# Patient Record
Sex: Male | Born: 1990 | Race: Black or African American | Hispanic: No | Marital: Single | Smoking: Former smoker
Health system: Southern US, Community
[De-identification: ages and names within clinical notes are randomized; demographics above are authoritative.]

---

## 2011-01-02 ENCOUNTER — Emergency Department (HOSPITAL_COMMUNITY)
Admission: EM | Admit: 2011-01-02 | Discharge: 2011-01-02 | Disposition: A | Discharge status: Home / Self Care | Payer: Self-pay | Attending: Emergency Medicine | Admitting: Emergency Medicine

## 2011-01-02 DIAGNOSIS — F410 Panic disorder [episodic paroxysmal anxiety] without agoraphobia: Secondary | ICD-10-CM | POA: Insufficient documentation

## 2011-01-02 DIAGNOSIS — E119 Type 2 diabetes mellitus without complications: Secondary | ICD-10-CM | POA: Insufficient documentation

## 2011-01-02 DIAGNOSIS — J45909 Unspecified asthma, uncomplicated: Secondary | ICD-10-CM | POA: Insufficient documentation

## 2011-01-02 DIAGNOSIS — Z79899 Other long term (current) drug therapy: Secondary | ICD-10-CM | POA: Insufficient documentation

## 2011-01-02 LAB — GLUCOSE, CAPILLARY: Glucose-Capillary: 116 mg/dL — ABNORMAL HIGH (ref 70–99)

## 2011-02-15 ENCOUNTER — Encounter: Payer: Self-pay | Admitting: *Deleted

## 2011-02-15 ENCOUNTER — Emergency Department (HOSPITAL_COMMUNITY)
Admission: EM | Admit: 2011-02-15 | Discharge: 2011-02-15 | Disposition: A | Discharge status: Home / Self Care | Payer: Self-pay | Attending: Emergency Medicine | Admitting: Emergency Medicine

## 2011-02-15 DIAGNOSIS — L270 Generalized skin eruption due to drugs and medicaments taken internally: Secondary | ICD-10-CM | POA: Insufficient documentation

## 2011-02-15 DIAGNOSIS — T7840XA Allergy, unspecified, initial encounter: Secondary | ICD-10-CM

## 2011-02-15 DIAGNOSIS — T394X5A Adverse effect of antirheumatics, not elsewhere classified, initial encounter: Secondary | ICD-10-CM | POA: Insufficient documentation

## 2011-02-15 DIAGNOSIS — L299 Pruritus, unspecified: Secondary | ICD-10-CM | POA: Insufficient documentation

## 2011-02-15 MED ORDER — DIPHENHYDRAMINE HCL 50 MG/ML IJ SOLN
25.0000 mg | Freq: Once | INTRAMUSCULAR | Status: AC
  Administered 2011-02-15: 25 mg via INTRAMUSCULAR
  Filled 2011-02-15: qty 1

## 2011-02-15 MED ORDER — DIPHENHYDRAMINE HCL 25 MG PO TABS: 25.0000 mg | ORAL_TABLET | Freq: Four times a day (QID) | ORAL | Status: AC

## 2011-02-15 MED ORDER — PREDNISONE 20 MG PO TABS
60.0000 mg | ORAL_TABLET | Freq: Once | ORAL | Status: AC
Start: 2011-02-15 — End: 2011-02-15
  Administered 2011-02-15: 60 mg via ORAL
  Filled 2011-02-15: qty 3

## 2011-02-15 MED ORDER — PREDNISONE 10 MG PO TABS: 20.0000 mg | ORAL_TABLET | Freq: Every day | ORAL | Status: DC

## 2011-02-15 NOTE — ED Notes (Signed)
Pt had no further questions regarding discharge. Vital signs stable. 

## 2011-02-15 NOTE — ED Notes (Signed)
Patient currently sitting up in bed; no respiratory or acute distress noted.  Patient updated on plan of care; informed patient that we have to wait 20 minutes after medication administration before being discharged.  Patient has no other questions or concerns at this time.  Will continue to monitor.

## 2011-02-15 NOTE — ED Notes (Signed)
Pt presents to department for evaluation of possible allergic reaction. Pt states he took advil on Thursday and is now itching all over body. Also states headache and fatigue. Respirations unlabored. Lung sounds clear and equal bilaterally. Pt conscious alert and oriented x4. No signs of distress at the time.

## 2011-02-15 NOTE — ED Provider Notes (Signed)
Medical screening examination/treatment/procedure(s) were performed by non-physician practitioner and as supervising physician I was immediately available for consultation/collaboration.   Neomia Herbel L Sunni Richardson, MD 02/15/11 0743 

## 2011-02-15 NOTE — ED Notes (Signed)
Pt thinks he has been having an "allergic reaction to advil he took Thursday".  Rash, HA, nausea

## 2011-02-15 NOTE — ED Provider Notes (Signed)
History     CSN: [id number] Arrival date & time: 02/15/2011 12:40 AM   First MD Initiated Contact with Patient 02/15/11 0123      Chief Complaint  Patient presents with  . Allergic Reaction    (Consider location/radiation/quality/duration/timing/severity/associated sxs/prior treatment) HPI Comments: Patient here with worsening rash and itching after having taken advil on Thursday  - reports no chest tightness, shortness of breath, throat swelling.  Patient is a 20 y.o. male presenting with allergic reaction. The history is provided by the patient. No language interpreter was used.  Allergic Reaction The primary symptoms are  rash and urticaria. The primary symptoms do not include wheezing, shortness of breath, cough, nausea, vomiting, diarrhea, dizziness, palpitations, altered mental status or angioedema. The current episode started 1 to 2 hours ago. The problem has not changed since onset. The rash is associated with itching.  The onset of the reaction was associated with a new medication. Significant symptoms also include itching. Significant symptoms that are not present include flushing or rhinorrhea.    Past Medical History  Diagnosis Date  . Asthma   . Diabetes mellitus     History reviewed. No pertinent past surgical history.  No family history on file.  History  Substance Use Topics  . Smoking status: Not on file  . Smokeless tobacco: Not on file  . Alcohol Use: No      Review of Systems  HENT: Negative for rhinorrhea.   Respiratory: Negative for cough, shortness of breath and wheezing.   Cardiovascular: Negative for palpitations.  Gastrointestinal: Negative for nausea, vomiting and diarrhea.  Skin: Positive for itching and rash. Negative for flushing.  Neurological: Negative for dizziness.  Psychiatric/Behavioral: Negative for altered mental status.  All other systems reviewed and are negative.    Allergies  Ibuprofen and Penicillins  Home Medications    Current Outpatient Rx  Name Route Sig Dispense Refill  . ALBUTEROL SULFATE HFA 108 (90 BASE) MCG/ACT IN AERS Inhalation Inhale 2 puffs into the lungs every 6 (six) hours as needed. FOR SHORTNESS OF BREATH     . FLUTICASONE-SALMETEROL 250-50 MCG/DOSE IN AEPB Inhalation Inhale 1 puff into the lungs every 12 (twelve) hours.      . INSULIN ASPART 100 UNIT/ML [place] SOLN Subcutaneous Inject 2-6 Units into the skin 3 (three) times daily before meals.      . INSULIN GLARGINE 100 UNIT/ML [place] SOLN Subcutaneous Inject 8 Units into the skin at bedtime.      Marland Kitchen MONTELUKAST SODIUM 10 MG PO TABS Oral Take 10 mg by mouth daily.        BP 101/50  Pulse 76  Temp(Src) 98.2 F (36.8 C) (Oral)  Resp 18  Ht 5\' 10"  (1.778 m)  Wt 130 lb (58.968 kg)  BMI 18.65 kg/m2  SpO2 98%  Physical Exam  Nursing note and vitals reviewed. Constitutional: He is oriented to person, place, and time. He appears well-developed and well-nourished. No distress.  HENT:  Head: Normocephalic and atraumatic.  Right Ear: External ear normal.  Left Ear: External ear normal.  Mouth/Throat: Oropharynx is clear and moist. No oropharyngeal exudate.  Eyes: Conjunctivae are normal. Pupils are equal, round, and reactive to light.  Neck: Normal range of motion. Neck supple.  Cardiovascular: Normal rate, regular rhythm and normal heart sounds.   Pulmonary/Chest: Effort normal and breath sounds normal. No respiratory distress. He has no wheezes. He has no rales.  Abdominal: Soft. Bowel sounds are normal.  Musculoskeletal: Normal range of motion.  Lymphadenopathy:    He has no cervical adenopathy.  Neurological: He is alert and oriented to person, place, and time. He has normal reflexes.  Skin: Rash noted.       Diffuse itchy macular rash to bilateral arms and legs    ED Course  Procedures (including critical care time)  Labs Reviewed - No data to display No results found.   Allergic Reaction    MDM  Patient with allergic  reaction to advil - will prescribe short burst of steriods and benadryl       Scarlette Calico C. [place], [place] 02/15/11 [phone]

## 2011-05-01 ENCOUNTER — Encounter (HOSPITAL_COMMUNITY): Payer: Self-pay | Admitting: *Deleted

## 2011-05-01 ENCOUNTER — Emergency Department (HOSPITAL_COMMUNITY): Payer: Self-pay

## 2011-05-01 ENCOUNTER — Emergency Department (HOSPITAL_COMMUNITY)
Admission: EM | Admit: 2011-05-01 | Discharge: 2011-05-01 | Disposition: A | Discharge status: Home / Self Care | Payer: Self-pay | Attending: Emergency Medicine | Admitting: Emergency Medicine

## 2011-05-01 DIAGNOSIS — Z794 Long term (current) use of insulin: Secondary | ICD-10-CM | POA: Insufficient documentation

## 2011-05-01 DIAGNOSIS — Z79899 Other long term (current) drug therapy: Secondary | ICD-10-CM | POA: Insufficient documentation

## 2011-05-01 DIAGNOSIS — R059 Cough, unspecified: Secondary | ICD-10-CM | POA: Insufficient documentation

## 2011-05-01 DIAGNOSIS — R0602 Shortness of breath: Secondary | ICD-10-CM | POA: Insufficient documentation

## 2011-05-01 DIAGNOSIS — E119 Type 2 diabetes mellitus without complications: Secondary | ICD-10-CM | POA: Insufficient documentation

## 2011-05-01 DIAGNOSIS — J45909 Unspecified asthma, uncomplicated: Secondary | ICD-10-CM | POA: Insufficient documentation

## 2011-05-01 DIAGNOSIS — R05 Cough: Secondary | ICD-10-CM | POA: Insufficient documentation

## 2011-05-01 MED ORDER — PREDNISONE 20 MG PO TABS: 60.0000 mg | ORAL_TABLET | Freq: Every day | ORAL | Status: AC

## 2011-05-01 MED ORDER — ALBUTEROL SULFATE HFA 108 (90 BASE) MCG/ACT IN AERS
2.0000 | INHALATION_SPRAY | RESPIRATORY_TRACT | Status: DC | PRN
  Administered 2011-05-01: 2 via RESPIRATORY_TRACT
  Filled 2011-05-01: qty 6.7

## 2011-05-01 MED ORDER — ALBUTEROL SULFATE (2.5 MG/3ML) 0.083% IN NEBU: 2.5000 mg | INHALATION_SOLUTION | Freq: Four times a day (QID) | RESPIRATORY_TRACT | Status: DC | PRN

## 2011-05-01 NOTE — ED Notes (Signed)
Pt is alert and oriented x 4 with respirations even and unlabored.  NAD at this time.  Discharge instructions reviewed with pt and pt verbalized understanding.  Pt ambulated to lobby with steady gait and friend to transport pt home.

## 2011-05-01 NOTE — Progress Notes (Signed)
No pcp, no insurance coverage listed. Reports he is presently a [place] resident -came in to ED with sob hx of asthma, reports being out of inhaler for last month- ED CM spoke with pt who confirms he has recently moved from [place] about 6 months ago He had a provider there but did not obtain a pcp in [place] yet.  CM reviewed self pay pcps [facility], [facility], urgent [facility] center, and [facility], [email or web address], and discounted outpatient pharmacies including outpatient pharmacy Pt voiced appreciation and understanding of resources offered

## 2011-05-01 NOTE — ED Provider Notes (Signed)
History     CSN: [id number]  Arrival date & time 05/01/11  1321   First MD Initiated Contact with Patient 05/01/11 1453      Chief Complaint  Patient presents with  . Asthma    (Consider location/radiation/quality/duration/timing/severity/associated sxs/prior treatment) The history is provided by the patient.   the patient is a 21 year old male, with asthma, and insulin-dependent diabetes, who presents to emergency department complaining of a nonproductive cough, and wheezing.  He states that he has run out of his albuterol nebulizer.  He denies nausea, vomiting, fevers, chills, or diaphoresis.  He no longer smokes.  He has no other complaints.  Past Medical History  Diagnosis Date  . Asthma   . Diabetes mellitus     History reviewed. No pertinent past surgical history.  History reviewed. No pertinent family history.  History  Substance Use Topics  . Smoking status: Not on file  . Smokeless tobacco: Not on file  . Alcohol Use: No      Review of Systems  Constitutional: Negative for fever and chills.  HENT: Negative for congestion.   Respiratory: Positive for cough, shortness of breath and wheezing.   Cardiovascular: Negative for chest pain.  Gastrointestinal: Negative for nausea and vomiting.  All other systems reviewed and are negative.    Allergies  Ibuprofen and Penicillins  Home Medications   Current Outpatient Rx  Name Route Sig Dispense Refill  . ALBUTEROL SULFATE HFA 108 (90 BASE) MCG/ACT IN AERS Inhalation Inhale 2 puffs into the lungs every 6 (six) hours as needed. FOR SHORTNESS OF BREATH     . FLUTICASONE-SALMETEROL 250-50 MCG/DOSE IN AEPB Inhalation Inhale 1 puff into the lungs every 12 (twelve) hours.      . INSULIN ASPART 100 UNIT/ML [place] SOLN Subcutaneous Inject 2-6 Units into the skin 3 (three) times daily before meals.      . INSULIN GLARGINE 100 UNIT/ML [place] SOLN Subcutaneous Inject 8 Units into the skin at bedtime.      . ALBUTEROL SULFATE (2.5  MG/3ML) 0.083% IN NEBU Nebulization Take 3 mLs (2.5 mg total) by nebulization every 6 (six) hours as needed for wheezing. 75 mL 12  . PREDNISONE 20 MG PO TABS Oral Take 3 tablets (60 mg total) by mouth daily. 9 tablet 0    BP 111/78  Pulse 78  Temp(Src) 98 F (36.7 C) (Oral)  Resp 20  SpO2 100%  Physical Exam  Constitutional: He is oriented to person, place, and time. He appears well-developed and well-nourished. No distress.  HENT:  Head: Normocephalic and atraumatic.  Eyes: EOM are normal. Pupils are equal, round, and reactive to light.  Neck: Normal range of motion. Neck supple.  Cardiovascular: Normal rate, regular rhythm and normal heart sounds.   No murmur heard. Pulmonary/Chest: Effort normal and breath sounds normal. No respiratory distress. He has no wheezes. He has no rales.  Abdominal: Soft. Bowel sounds are normal. He exhibits no distension and no mass. There is no tenderness. There is no rebound and no guarding.  Musculoskeletal: Normal range of motion. He exhibits no edema and no tenderness.  Neurological: He is alert and oriented to person, place, and time. No cranial nerve deficit.  Skin: Skin is warm and dry. He is not diaphoretic.  Psychiatric: He has a normal mood and affect. His behavior is normal.    ED Course  Procedures (including critical care time) Asthma, ran out of meds.  No wheezing now. No rep distress or hypoxia.  Labs Reviewed -  No data to display Dg Chest 2 View  05/01/2011  *RADIOLOGY REPORT*  Clinical Data: Shortness of breath, chest tightness, cough  CHEST - 2 VIEW  Comparison: None.  Findings: The lungs are clear and hyperaerated.  Mediastinal contours appear normal.  The heart is within normal limits in size. No bony abnormality is seen.  IMPRESSION: Slight hyperaeration.  No active lung disease.  Original Report Authenticated By: Juline Patch, M.D.     1. Asthma       MDM  asthma        Nicholes Stairs, MD 05/01/11 [phone]

## 2011-05-01 NOTE — Discharge Instructions (Signed)
Your chest x-ray does not show any pneumonia.  Use albuterol for shortness of breath and wheezing.  Use prednisone to reduce inflammation in your lungs and prevent recurrences.  Return for worse or uncontrolled symptoms.

## 2011-05-01 NOTE — ED Notes (Signed)
Pt in c/o shortness of breath, pt with history of asthma, states he has been out of his inhaler for last month, no distress at this time, states he has noted a cough over last few days

## 2011-05-31 ENCOUNTER — Encounter (HOSPITAL_COMMUNITY): Payer: Self-pay

## 2011-05-31 ENCOUNTER — Emergency Department (HOSPITAL_COMMUNITY)
Admission: EM | Admit: 2011-05-31 | Discharge: 2011-05-31 | Disposition: A | Discharge status: Home / Self Care | Payer: Self-pay | Attending: Emergency Medicine | Admitting: Emergency Medicine

## 2011-05-31 DIAGNOSIS — R300 Dysuria: Secondary | ICD-10-CM | POA: Insufficient documentation

## 2011-05-31 DIAGNOSIS — E119 Type 2 diabetes mellitus without complications: Secondary | ICD-10-CM | POA: Insufficient documentation

## 2011-05-31 DIAGNOSIS — N342 Other urethritis: Secondary | ICD-10-CM | POA: Insufficient documentation

## 2011-05-31 DIAGNOSIS — J45909 Unspecified asthma, uncomplicated: Secondary | ICD-10-CM | POA: Insufficient documentation

## 2011-05-31 DIAGNOSIS — R369 Urethral discharge, unspecified: Secondary | ICD-10-CM | POA: Insufficient documentation

## 2011-05-31 MED ORDER — CEFTRIAXONE SODIUM 250 MG IJ SOLR
250.0000 mg | Freq: Once | INTRAMUSCULAR | Status: AC
  Administered 2011-05-31: 250 mg via INTRAMUSCULAR
  Filled 2011-05-31: qty 250

## 2011-05-31 MED ORDER — AZITHROMYCIN 250 MG PO TABS
1000.0000 mg | ORAL_TABLET | Freq: Once | ORAL | Status: AC
  Administered 2011-05-31: 1000 mg via ORAL
  Filled 2011-05-31: qty 4

## 2011-05-31 MED ORDER — LIDOCAINE HCL 1 % IJ SOLN
INTRAMUSCULAR | Status: AC
  Filled 2011-05-31: qty 20

## 2011-05-31 MED ORDER — METRONIDAZOLE 500 MG PO TABS
2000.0000 mg | ORAL_TABLET | Freq: Once | ORAL | Status: AC
  Administered 2011-05-31: 2000 mg via ORAL
  Filled 2011-05-31: qty 4

## 2011-05-31 NOTE — ED Provider Notes (Signed)
History     CSN: [id number]  Arrival date & time 05/31/11  1654   First MD Initiated Contact with Patient 05/31/11 1909      Chief Complaint  Patient presents with  . SEXUALLY TRANSMITTED DISEASE    (Consider location/radiation/quality/duration/timing/severity/associated sxs/prior treatment) Patient is a 21 y.o. male presenting with penile discharge. The history is provided by the patient.  Penile Discharge This is a new problem. The current episode started in the past 7 days. The problem occurs constantly. Pertinent negatives include no abdominal pain, chills, fever, nausea or vomiting.  Pt states he is having penile discharge for the last several days. States it is yellow. States pain with urination. Has had unprotected intercourse recently. Denies any penile lesions, fever, chills, abdominal pain.  Past Medical History  Diagnosis Date  . Asthma   . Diabetes mellitus     History reviewed. No pertinent past surgical history.  No family history on file.  History  Substance Use Topics  . Smoking status: Not on file  . Smokeless tobacco: Not on file  . Alcohol Use: No      Review of Systems  Constitutional: Negative for fever and chills.  HENT: Negative.   Eyes: Negative.   Respiratory: Negative.   Cardiovascular: Negative.   Gastrointestinal: Negative.  Negative for nausea, vomiting and abdominal pain.  Genitourinary: Positive for dysuria and discharge.  Musculoskeletal: Negative.   Neurological: Negative.   Psychiatric/Behavioral: Negative.     Allergies  Ibuprofen and Penicillins  Home Medications   Current Outpatient Rx  Name Route Sig Dispense Refill  . ALBUTEROL SULFATE HFA 108 (90 BASE) MCG/ACT IN AERS Inhalation Inhale 2 puffs into the lungs every 6 (six) hours as needed. FOR SHORTNESS OF BREATH     . ALBUTEROL SULFATE (2.5 MG/3ML) 0.083% IN NEBU Nebulization Take 3 mLs (2.5 mg total) by nebulization every 6 (six) hours as needed for wheezing. 75 mL  12  . FLUTICASONE-SALMETEROL 250-50 MCG/DOSE IN AEPB Inhalation Inhale 1 puff into the lungs every 12 (twelve) hours.      . INSULIN ASPART 100 UNIT/ML [place] SOLN Subcutaneous Inject 2-6 Units into the skin 3 (three) times daily before meals.      . INSULIN GLARGINE 100 UNIT/ML  SOLN Subcutaneous Inject 8 Units into the skin at bedtime.        BP 107/64  Pulse 75  Temp(Src) 98.3 F (36.8 C) (Oral)  Resp 18  SpO2 99%  Physical Exam  Nursing note and vitals reviewed. Constitutional: He is oriented to person, place, and time. He appears well-developed and well-nourished.  Eyes: Conjunctivae are normal.  Neck: Neck supple.  Cardiovascular: Normal rate, regular rhythm and normal heart sounds.   Pulmonary/Chest: Effort normal and breath sounds normal. No respiratory distress.  Genitourinary:       Purulent penile drainage, no lesions, no tenderness over penis or scrotum  Neurological: He is alert and oriented to person, place, and time.  Skin: Skin is warm and dry.  Psychiatric: He has a normal mood and affect.    ED Course  Procedures (including critical care time)   Labs Reviewed  GC/CHLAMYDIA PROBE AMP, GENITAL   Pt with penile drainage on exam. He is otherwise non toxic, no scrotal pain or tenderness. VS normal. Will treat with rocephin, zithromax, flagyl. GC/chlamydia sent.   No diagnosis found.    MDM          Lottie Mussel, PA 05/31/11 1949

## 2011-05-31 NOTE — Discharge Instructions (Signed)
Today you were treated for possible sexual transmitted infection. Cultures are pending will be back in 2-3 days. Avoid intercourse for 5 days, always use protection. Follow up with health department.   Urethritis, Adult Urethritis is an inflammation (soreness) of the urethra (the tube exiting from the bladder). It is often caused by germs that may be spread through sexual contact. TREATMENT  Urethritis will usually respond to antibiotics. These are medications that kill germs. Take all the medicine given to you. You may feel better in a couple days, but TAKE ALL MEDICINE or the infection may not be completely cured and may become more difficult to treat. Response can generally be expected in 7 to 10 days. You may require additional treatment after more testing. HOME CARE INSTRUCTIONS  Not have sex until the test results are known and treatment is completed.   Know that you may be asked to notify your sex partner when your final test results are back.   Finish all medications as prescribed.   Prevent sexually transmitted infections including AIDS. Practice safe sex. Use condoms.  SEEK MEDICAL CARE IF:   Your symptoms are not improved in 2 to 3 days.   Your symptoms are getting worse.   Your develop abdominal pain.   You develop joint pain.  SEEK IMMEDIATE MEDICAL CARE IF:   You have a fever.   You develop severe pain in the belly, back or side.   You develop repeated vomiting.  TEST RESULTS Not all test results are available during your visit. If your test results are not back during the visit, make an appointment with your caregiver to find out the results. Do not assume everything is normal if you have not heard from your caregiver or the medical facility. It is important for you to follow-up on all of your test results. Document Released: 08/19/2000 Document Revised: 02/12/2011 Document Reviewed: 03/11/2009 [facility] Patient Information 2012 [place], [place].

## 2011-05-31 NOTE — ED Provider Notes (Signed)
Medical screening examination/treatment/procedure(s) were performed by non-physician practitioner and as supervising physician I was immediately available for consultation/collaboration.  Marvette Schamp T Feven Alderfer, MD 05/31/11 2328 

## 2011-05-31 NOTE — ED Notes (Signed)
Patient reports that he has had penile discharge with dysuria x 1 week.

## 2011-06-02 NOTE — ED Notes (Signed)
+   Chlamydia + Gonorrhea Treated per protocol MD

## 2011-06-19 NOTE — ED Notes (Signed)
Pt called in to check on std results; informed of results. Instructed in safe sex; states partners have already been notified; denies s/s now.

## 2011-07-17 ENCOUNTER — Emergency Department (HOSPITAL_COMMUNITY)
Admission: EM | Admit: 2011-07-17 | Discharge: 2011-07-17 | Disposition: A | Discharge status: Home / Self Care | Payer: Self-pay | Attending: Emergency Medicine | Admitting: Emergency Medicine

## 2011-07-17 ENCOUNTER — Encounter (HOSPITAL_COMMUNITY): Payer: Self-pay | Admitting: Family Medicine

## 2011-07-17 DIAGNOSIS — R369 Urethral discharge, unspecified: Secondary | ICD-10-CM | POA: Insufficient documentation

## 2011-07-17 DIAGNOSIS — Z794 Long term (current) use of insulin: Secondary | ICD-10-CM | POA: Insufficient documentation

## 2011-07-17 DIAGNOSIS — Z202 Contact with and (suspected) exposure to infections with a predominantly sexual mode of transmission: Secondary | ICD-10-CM | POA: Insufficient documentation

## 2011-07-17 DIAGNOSIS — J45909 Unspecified asthma, uncomplicated: Secondary | ICD-10-CM | POA: Insufficient documentation

## 2011-07-17 DIAGNOSIS — R739 Hyperglycemia, unspecified: Secondary | ICD-10-CM

## 2011-07-17 DIAGNOSIS — E1169 Type 2 diabetes mellitus with other specified complication: Secondary | ICD-10-CM | POA: Insufficient documentation

## 2011-07-17 LAB — HIV ANTIBODY (ROUTINE TESTING W REFLEX): HIV: NONREACTIVE

## 2011-07-17 LAB — CBC
Hemoglobin: 15.8 g/dL (ref 13.0–17.0)
MCH: 28 pg (ref 26.0–34.0)
MCV: 75.6 fL — ABNORMAL LOW (ref 78.0–100.0)
RBC: 5.65 MIL/uL (ref 4.22–5.81)

## 2011-07-17 LAB — URINALYSIS, ROUTINE W REFLEX MICROSCOPIC
Glucose, UA: 500 mg/dL — AB
Hgb urine dipstick: NEGATIVE
Protein, ur: NEGATIVE mg/dL
Specific Gravity, Urine: 1.03 (ref 1.005–1.030)
pH: 6.5 (ref 5.0–8.0)

## 2011-07-17 LAB — DIFFERENTIAL
Basophils Relative: 1 % (ref 0–1)
Eosinophils Relative: 5 % (ref 0–5)
Monocytes Absolute: 0.6 10*3/uL (ref 0.1–1.0)
Neutrophils Relative %: 44 % (ref 43–77)

## 2011-07-17 LAB — BLOOD GAS, VENOUS
Bicarbonate: 27.7 mEq/L — ABNORMAL HIGH (ref 20.0–24.0)
FIO2: 0.21 %
pCO2, Ven: 55.3 mmHg — ABNORMAL HIGH (ref 45.0–50.0)
pH, Ven: 7.32 — ABNORMAL HIGH (ref 7.250–7.300)
pO2, Ven: 53.4 mmHg — ABNORMAL HIGH (ref 30.0–45.0)

## 2011-07-17 LAB — BASIC METABOLIC PANEL
CO2: 28 mEq/L (ref 19–32)
Calcium: 8.7 mg/dL (ref 8.4–10.5)
Glucose, Bld: 842 mg/dL (ref 70–99)
Sodium: 126 mEq/L — ABNORMAL LOW (ref 135–145)

## 2011-07-17 LAB — GC/CHLAMYDIA PROBE AMP, GENITAL: GC Probe Amp, Genital: NEGATIVE

## 2011-07-17 LAB — RPR: RPR Ser Ql: NONREACTIVE

## 2011-07-17 LAB — GLUCOSE, CAPILLARY: Glucose-Capillary: >600 mg/dL (ref 70–99)

## 2011-07-17 MED ORDER — SODIUM CHLORIDE 0.9 % IV BOLUS (SEPSIS)
1000.0000 mL | Freq: Once | INTRAVENOUS | Status: AC
  Administered 2011-07-17 (×2): 1000 mL via INTRAVENOUS

## 2011-07-17 MED ORDER — INSULIN ASPART 100 UNIT/ML ~~LOC~~ SOLN
10.0000 [IU] | Freq: Once | SUBCUTANEOUS | Status: AC
  Administered 2011-07-17: 10 [IU] via INTRAVENOUS
  Filled 2011-07-17: qty 10

## 2011-07-17 MED ORDER — AZITHROMYCIN 1 G PO PACK
1.0000 g | PACK | ORAL | Status: AC
  Administered 2011-07-17: 1 g via ORAL
  Filled 2011-07-17: qty 1

## 2011-07-17 MED ORDER — CEFTRIAXONE SODIUM 250 MG IJ SOLR
250.0000 mg | Freq: Once | INTRAMUSCULAR | Status: AC
  Administered 2011-07-17: 250 mg via INTRAMUSCULAR
  Filled 2011-07-17: qty 250

## 2011-07-17 MED ORDER — INSULIN REGULAR HUMAN 100 UNIT/ML IJ SOLN: 10.0000 [IU] | Freq: Once | INTRAMUSCULAR | Status: DC

## 2011-07-17 MED ORDER — SODIUM CHLORIDE 0.9 % IV BOLUS (SEPSIS)
1000.0000 mL | Freq: Once | INTRAVENOUS | Status: AC
  Administered 2011-07-17: 1000 mL via INTRAVENOUS

## 2011-07-17 NOTE — ED Notes (Addendum)
oj given drank 240cc

## 2011-07-17 NOTE — ED Notes (Signed)
Pt complains of high blood sugar, he states he takes insulin and usually has his blood sugar under control, he also complains of a discharge from his penis

## 2011-07-17 NOTE — ED Provider Notes (Signed)
History     CSN: [id number]  Arrival date & time 07/17/11  0454   First MD Initiated Contact with Patient 07/17/11 0534      Chief Complaint  Patient presents with  . Hyperglycemia  . Penile Discharge    (Consider location/radiation/quality/duration/timing/severity/associated sxs/prior treatment) Patient is a 21 y.o. male presenting with penile discharge. The history is provided by the patient.  Penile Discharge This is a recurrent problem. Pertinent negatives include no chest pain, no abdominal pain, no headaches and no shortness of breath.   patient comes in with some penile discharge. He states it is crusty light. He was treated for gonorrhea and chlamydia at the end of March. He states he's had unprotected sex since. Patient is also complaining of high blood sugar. He states his been out of his insulin and testing supplies for a while. He states he does not have a primary care Dr. and his Medicaid ran out. He states he is hungry all the time thirsty all the time and urinating all the time. No abdominal pain. No fevers. No lightheadedness dizziness. He states he has had high blood sugar before.  Past Medical History  Diagnosis Date  . Asthma   . Diabetes mellitus     History reviewed. No pertinent past surgical history.  No family history on file.  History  Substance Use Topics  . Smoking status: Not on file  . Smokeless tobacco: Not on file  . Alcohol Use: No      Review of Systems  Constitutional: Negative for activity change and appetite change.  HENT: Negative for neck stiffness.   Eyes: Negative for pain.  Respiratory: Negative for chest tightness and shortness of breath.   Cardiovascular: Negative for chest pain and leg swelling.  Gastrointestinal: Negative for nausea, vomiting, abdominal pain and diarrhea.  Genitourinary: Positive for discharge. Negative for hematuria, flank pain, penile swelling, penile pain and testicular pain.  Musculoskeletal: Negative  for back pain.  Skin: Negative for rash.  Neurological: Negative for weakness, numbness and headaches.  Psychiatric/Behavioral: Negative for behavioral problems.    Allergies  Ibuprofen and Penicillins  Home Medications   Current Outpatient Rx  Name Route Sig Dispense Refill  . ALBUTEROL SULFATE HFA 108 (90 BASE) MCG/ACT IN AERS Inhalation Inhale 2 puffs into the lungs every 6 (six) hours as needed. FOR SHORTNESS OF BREATH     . FLUTICASONE-SALMETEROL 250-50 MCG/DOSE IN AEPB Inhalation Inhale 1 puff into the lungs every 12 (twelve) hours.      . ALBUTEROL SULFATE (2.5 MG/3ML) 0.083% IN NEBU Nebulization Take 3 mLs (2.5 mg total) by nebulization every 6 (six) hours as needed for wheezing. 75 mL 12  . INSULIN ASPART 100 UNIT/ML [place] SOLN Subcutaneous Inject 2-6 Units into the skin 3 (three) times daily before meals.      . INSULIN GLARGINE 100 UNIT/ML [place] SOLN Subcutaneous Inject 8 Units into the skin at bedtime.        BP 114/63  Pulse 61  Temp(Src) 97.6 F (36.4 C) (Oral)  Resp 20  SpO2 100%  Physical Exam  Nursing note and vitals reviewed. Constitutional: He is oriented to person, place, and time. He appears well-developed and well-nourished.  HENT:  Head: Normocephalic and atraumatic.  Eyes: EOM are normal. Pupils are equal, round, and reactive to light.  Neck: Normal range of motion. Neck supple.  Cardiovascular: Normal rate, regular rhythm and normal heart sounds.   No murmur heard. Pulmonary/Chest: Effort normal and breath sounds normal.  Abdominal: Soft. Bowel sounds are normal. He exhibits no distension and no mass. There is no tenderness. There is no rebound and no guarding.  Genitourinary: Penis normal.  Musculoskeletal: Normal range of motion. He exhibits no edema.  Neurological: He is alert and oriented to person, place, and time. No cranial nerve deficit.  Skin: Skin is warm and dry.  Psychiatric: He has a normal mood and affect.    ED Course  Procedures  (including critical care time)  Labs Reviewed  GLUCOSE, CAPILLARY - Abnormal; Notable for the following:    Glucose-Capillary >600 (*)    All other components within normal limits  CBC  DIFFERENTIAL  BASIC METABOLIC PANEL  URINALYSIS, ROUTINE W REFLEX MICROSCOPIC  BLOOD GAS, VENOUS  GC/CHLAMYDIA PROBE AMP, GENITAL  RPR  HIV ANTIBODY (ROUTINE TESTING)   No results found.   No diagnosis found.    MDM  Patient with hyperglycemia without apparent DKA. Reassuring vitals. Also has had unprotected sex. He has recently had gonorrhea and chlamydia. He had a penile swab done and he was tested for HIV and syphilis. Fluid boluses were given and patient was given IV insulin. Care was turned over to Dr. Judd Lien.        Juliet Rude. Rubin Payor, MD 07/17/11 [phone]

## 2011-07-17 NOTE — Discharge Instructions (Signed)
High Blood Sugar  High blood sugar (hyperglycemia) means that the level of sugar in your blood is higher than it should be. Signs of high blood sugar include:   Feeling thirsty.   Frequent peeing (urinating).   Feeling tired or sleepy.   Dry mouth.   Vision changes.   Feeling weak.   Feeling hungry but losing weight.   Numbness and tingling in your hands or feet.   Headache.  When you ignore these signs, your blood sugar may keep going up. These problems may get worse, and other problems may begin.  HOME CARE   Check your blood sugars as told by your doctor. Write down the numbers with the date and time.   Take the right amount of insulin or diabetes pills at the right time. Write down the dose with date and time.   Refill your insulin or diabetes pills before running out.   Watch what you eat. Follow your meal plan.   Drink liquids without sugar, such as water. Check with your doctor if you have kidney or heart disease.   Follow your doctor's orders for exercise. Exercise at the same time of day.   Keep your doctor's appointments.  GET HELP RIGHT AWAY IF:    You have trouble thinking or are confused.   You have fast breathing with fruity smelling breath.   You pass out (faint).   You have 2 to 3 days of high blood sugars and you do not know why.   You have chest pain.   You are feeling sick to your stomach (nauseous) or throwing up (vomiting).   You have sudden vision changes.  MAKE SURE YOU:    Understand these instructions.   Will watch your condition.   Will get help right away if you are not doing well or get worse.  Document Released: 12/21/2008 Document Revised: 02/12/2011 Document Reviewed: 12/21/2008  [employer] Patient Information 2012 [employer].

## 2011-07-17 NOTE — ED Notes (Signed)
Patient states that he does not have strips for his meter. States that he has felt that his sugar has been elevated since last week. Out of Novolog. Recently had unprotected sex; c/o penile discharge.

## 2011-07-19 ENCOUNTER — Encounter (HOSPITAL_COMMUNITY): Payer: Self-pay | Admitting: Emergency Medicine

## 2011-07-19 ENCOUNTER — Emergency Department (HOSPITAL_COMMUNITY)
Admission: EM | Admit: 2011-07-19 | Discharge: 2011-07-19 | Disposition: A | Discharge status: Home / Self Care | Payer: Self-pay | Attending: Emergency Medicine | Admitting: Emergency Medicine

## 2011-07-19 DIAGNOSIS — Z794 Long term (current) use of insulin: Secondary | ICD-10-CM | POA: Insufficient documentation

## 2011-07-19 DIAGNOSIS — R739 Hyperglycemia, unspecified: Secondary | ICD-10-CM

## 2011-07-19 DIAGNOSIS — J45909 Unspecified asthma, uncomplicated: Secondary | ICD-10-CM | POA: Insufficient documentation

## 2011-07-19 DIAGNOSIS — E109 Type 1 diabetes mellitus without complications: Secondary | ICD-10-CM | POA: Insufficient documentation

## 2011-07-19 LAB — CBC
HCT: 42 % (ref 39.0–52.0)
Hemoglobin: 15.1 g/dL (ref 13.0–17.0)
MCH: 27.7 pg (ref 26.0–34.0)
MCHC: 36 g/dL (ref 30.0–36.0)
MCV: 77.1 fL — ABNORMAL LOW (ref 78.0–100.0)

## 2011-07-19 LAB — GLUCOSE, CAPILLARY
Glucose-Capillary: >600 mg/dL (ref 70–99)
Glucose-Capillary: >600 mg/dL (ref 70–99)
Glucose-Capillary: 69 mg/dL — ABNORMAL LOW (ref 70–99)
Glucose-Capillary: 55 mg/dL — ABNORMAL LOW (ref 70–99)

## 2011-07-19 LAB — DIFFERENTIAL
Band Neutrophils: 0 % (ref 0–10)
Basophils Absolute: 0.1 10*3/uL (ref 0.0–0.1)
Basophils Relative: 2 % — ABNORMAL HIGH (ref 0–1)
Blasts: 0 %
Lymphs Abs: 2.1 10*3/uL (ref 0.7–4.0)
Metamyelocytes Relative: 0 %
Promyelocytes Absolute: 0 %

## 2011-07-19 LAB — BASIC METABOLIC PANEL
CO2: 24 mEq/L (ref 19–32)
Calcium: 9.8 mg/dL (ref 8.4–10.5)
Chloride: 88 mEq/L — ABNORMAL LOW (ref 96–112)
Creatinine, Ser: 0.92 mg/dL (ref 0.50–1.35)
GFR calc Af Amer: >90 mL/min (ref >90)
Sodium: 125 mEq/L — ABNORMAL LOW (ref 135–145)

## 2011-07-19 MED ORDER — INSULIN REGULAR HUMAN 100 UNIT/ML IJ SOLN: 10.0000 [IU] | Freq: Once | INTRAMUSCULAR | Status: DC

## 2011-07-19 MED ORDER — INSULIN ASPART 100 UNIT/ML ~~LOC~~ SOLN: 2.0000 [IU] | Freq: Three times a day (TID) | SUBCUTANEOUS | Status: DC

## 2011-07-19 MED ORDER — INSULIN ASPART 100 UNIT/ML ~~LOC~~ SOLN
10.0000 [IU] | Freq: Once | SUBCUTANEOUS | Status: AC
  Administered 2011-07-19: 10 [IU] via SUBCUTANEOUS
  Filled 2011-07-19: qty 1

## 2011-07-19 MED ORDER — INSULIN GLARGINE 100 UNIT/ML ~~LOC~~ SOLN: 8.0000 [IU] | Freq: Every day | SUBCUTANEOUS | Status: DC

## 2011-07-19 MED ORDER — SODIUM CHLORIDE 0.9 % IV SOLN
INTRAVENOUS | Status: DC
  Administered 2011-07-19: 20 mL/h via INTRAVENOUS

## 2011-07-19 MED ORDER — SODIUM CHLORIDE 0.9 % IV SOLN
INTRAVENOUS | Status: DC
  Administered 2011-07-19: 8.5 [IU]/h via INTRAVENOUS
  Filled 2011-07-19: qty 1

## 2011-07-19 MED ORDER — DEXTROSE 50 % IV SOLN
25.0000 mL | INTRAVENOUS | Status: DC | PRN
  Administered 2011-07-19: 12 mL via INTRAVENOUS
  Administered 2011-07-19: 38 mL via INTRAVENOUS
  Filled 2011-07-19: qty 50

## 2011-07-19 MED ORDER — SODIUM CHLORIDE 0.9 % IV BOLUS (SEPSIS)
1000.0000 mL | Freq: Once | INTRAVENOUS | Status: AC
  Administered 2011-07-19: 1000 mL via INTRAVENOUS

## 2011-07-19 MED ORDER — DEXTROSE-NACL 5-0.45 % IV SOLN
INTRAVENOUS | Status: DC
  Administered 2011-07-19: 08:00:00 via INTRAVENOUS

## 2011-07-19 NOTE — Discharge Instructions (Signed)
Read the information below.  Please monitor your blood sugar closely and use the insulin as directed.  Use the resources given to your by the case manager to find a primary care provider for close follow up.   There are also resources at the bottom of this page for primary care providers who see patients without insurance.  Please return to the ER at any time for worsening condition or any new symptoms that concern you.   Blood Sugar Monitoring, Adult GLUCOSE METERS FOR SELF-MONITORING OF BLOOD GLUCOSE  It is important to be able to correctly measure your blood sugar (glucose). You can use a blood glucose monitor (a small battery-operated device) to check your glucose level at any time. This allows you and your caregiver to monitor your diabetes and to determine how well your treatment plan is working. The process of monitoring your blood glucose with a glucose meter is called self-monitoring of blood glucose (SMBG). When people with diabetes control their blood sugar, they have better health. To test for glucose with a typical glucose meter, place the disposable strip in the meter. Then place a small sample of blood on the "test strip." The test strip is coated with chemicals that combine with glucose in blood. The meter measures how much glucose is present. The meter displays the glucose level as a number. Several new models can record and store a number of test results. Some models can connect to personal computers to store test results or print them out.  Newer meters are often easier to use than older models. Some meters allow you to get blood from places other than your fingertip. Some new models have automatic timing, error codes, signals, or barcode readers to help with proper adjustment (calibration). Some meters have a large display screen or spoken instructions for people with visual impairments.  INSTRUCTIONS FOR USING GLUCOSE METERS  Wash your hands with soap and warm water, or clean the area  with alcohol. Dry your hands completely.   Prick the side of your fingertip with a lancet (a sharp-pointed tool used by hand).   Hold the hand down and gently milk the finger until a small drop of blood appears. Catch the blood with the test strip.   Follow the instructions for inserting the test strip and using the SMBG meter. Most meters require the meter to be turned on and the test strip to be inserted before applying the blood sample.   Record the test result.   Read the instructions carefully for both the meter and the test strips that go with it. Meter instructions are found in the user manual. Keep this manual to help you solve any problems that may arise. Many meters use "error codes" when there is a problem with the meter, the test strip, or the blood sample on the strip. You will need the manual to understand these error codes and fix the problem.   New devices are available such as laser lancets and meters that can test blood taken from "alternative sites" of the body, other than fingertips. However, you should use standard fingertip testing if your glucose changes rapidly. Also, use standard testing if:   You have eaten, exercised, or taken insulin in the past 2 hours.   You think your glucose is low.   You tend to not feel symptoms of low blood glucose (hypoglycemia).   You are ill or under stress.   Clean the meter as directed by the manufacturer.   Test the  meter for accuracy as directed by the manufacturer.   Take your meter with you to your caregiver's office. This way, you can test your glucose in front of your caregiver to make sure you are using the meter correctly. Your caregiver can also take a sample of blood to test using a routine lab method. If values on the glucose meter are close to the lab results, you and your caregiver will see that your meter is working well and you are using good technique. Your caregiver will advise you about what to do if the results do  not match.  FREQUENCY OF TESTING  Your caregiver will tell you how often you should check your blood glucose. This will depend on your type of diabetes, your current level of diabetes control, and your types of medicines. The following are general guidelines, but your care plan may be different. Record all your readings and the time of day you took them for review with your caregiver.   Diabetes type 1.   When you are using insulin with good diabetic control (either multiple daily injections or via a pump), you should check your glucose 4 times a day.   If your diabetes is not well controlled, you may need to monitor more frequently, including before meals and 2 hours after meals, at bedtime, and occasionally between 2 a.m. and 3 a.m.   You should always check your glucose before a dose of insulin or before changing the rate on your insulin pump.   Diabetes type 2.   Guidelines for SMBG in diabetes type 2 are not as well defined.   If you are on insulin, follow the guidelines above.   If you are on medicines, but not insulin, and your glucose is not well controlled, you should test at least twice daily.   If you are not on insulin, and your diabetes is controlled with medicines or diet alone, you should test at least once daily, usually before breakfast.   A weekly profile will help your caregiver advise you on your care plan. The week before your visit, check your glucose before a meal and 2 hours after a meal at least daily. You may want to test before and after a different meal each day so you and your caregiver can tell how well controlled your blood sugars are throughout the course of a 24 hour period.   Gestational diabetes (diabetes during pregnancy).   Frequent testing is often necessary. Accurate timing is important.   If you are not on insulin, check your glucose 4 times a day. Check it before breakfast and 1 hour after the start of each meal.   If you are on insulin, check  your glucose 6 times a day. Check it before each meal and 1 hour after the first bite of each meal.   General guidelines.   More frequent testing is required at the start of insulin treatment. Your caregiver will instruct you.   Test your glucose any time you suspect you have low blood sugar (hypoglycemia).   You should test more often when you change medicines, when you have unusual stress or illness, or in other unusual circumstances.  OTHER THINGS TO KNOW ABOUT GLUCOSE METERS  Measurement Range. Most glucose meters are able to read glucose levels over a broad range of values from as low as 0 to as high as 600 mg/dL. If you get an extremely high or low reading from your meter, you should first confirm it with  another reading. Report very high or very low readings to your caregiver.   Whole Blood Glucose versus Plasma Glucose. Some older home glucose meters measure glucose in your whole blood. In a lab or when using some newer home glucose meters, the glucose is measured in your plasma (one component of blood). The difference can be important. It is important for you and your caregiver to know whether your meter gives its results as "whole blood equivalent" or "plasma equivalent."   Display of High and Low Glucose Values. Part of learning how to operate a meter is understanding what the meter results mean. Know how high and low glucose concentrations are displayed on your meter.   Factors that Affect Glucose Meter Performance. The accuracy of your test results depends on many factors and varies depending on the brand and type of meter. These factors include:   Low red blood cell count (anemia).   Substances in your blood (such as uric acid, vitamin C, and others).   Environmental factors (temperature, humidity, altitude).   Name-brand versus generic test strips.   Calibration. Make sure your meter is set up properly. It is a good idea to do a calibration test with a control solution  recommended by the manufacturer of your meter whenever you begin using a fresh bottle of test strips. This will help verify the accuracy of your meter.   Improperly stored, expired, or defective test strips. Keep your strips in a dry place with the lid on.   Soiled meter.   Inadequate blood sample.  NEW TECHNOLOGIES FOR GLUCOSE TESTING Alternative site testing Some glucose meters allow testing blood from alternative sites. These include the:  Upper arm.   Forearm.   Base of the thumb.   Thigh.  Sampling blood from alternative sites may be desirable. However, it may have some limitations. Blood in the fingertips show changes in glucose levels more quickly than blood in other parts of the body. This means that alternative site test results may be different from fingertip test results, not because of the meter's ability to test accurately, but because the actual glucose concentration can be different.  Continuous Glucose Monitoring Devices to measure your blood glucose continuously are available, and others are in development. These methods can be more expensive than self-monitoring with a glucose meter. However, it is uncertain how effective and reliable these devices are. Your caregiver will advise you if this approach makes sense for you. IF BLOOD SUGARS ARE CONTROLLED, PEOPLE WITH DIABETES REMAIN HEALTHIER.  SMBG is an important part of the treatment plan of patients with diabetes mellitus. Below are reasons for using SMBG:   It confirms that your glucose is at a specific, healthy level.   It detects hypoglycemia and severe hyperglycemia.   It allows you and your caregiver to make adjustments in response to changes in lifestyle for individuals requiring medicine.   It determines the need for starting insulin therapy in temporary diabetes that happens during pregnancy (gestational diabetes).  Document Released: 02/26/2003 Document Revised: 02/12/2011 Document Reviewed:  06/19/2010 [facility] Patient Information 2012 [place], [place].  Diabetes, Type 1 Diabetes is a long-term (chronic) disease. It occurs when the cells in the pancreas that make insulin (a hormone) are destroyed and can no longer make insulin. Type 1 diabetes was also previously called juvenile-onset diabetes. It most often occurs before the age of 31, but it can also occur in older people. CAUSES  Among other factors, the following may cause type 1 diabetes:  Genetics.  This means it may be passed to you by your parents.   The beta cells that make insulin are destroyed. The cause of this is unknown.  SYMPTOMS   Urinating more than usual (or bed-wetting in children).   Drinking more than usual.   Irritability.   Feeling very hungry.   Weight loss (may be rapid).   Nausea and vomiting.   Abdominal pain.   Feeling more tired than usual (fatigue).   Rapid breathing.   Difficulty staying awake.   Night sweats.  DIAGNOSIS  Your blood is tested to determine whether you have type 1 diabetes. TREATMENT   You will need to check your blood glucose (sugar) levels several times a day.   You will need to balance insulin, a healthy meal plan, and exercise to maintain normal blood glucose.   Education and ongoing support is recommended.   You should have regular checkups and immunizations.  HOME CARE INSTRUCTIONS   Never run out of insulin. It is needed every day.   Do not skip insulin doses.   Do not skip meals. Eat healthy.   Follow your treatment and monitoring plan.   Wear a pendant or bracelet stating you have diabetes and take insulin.   If you start a new exercise or sport, watch for low blood glucose (hypoglycemia) symptoms. Insulin dosing may need to be adjusted.   Follow up with your caregiver regularly.   Tell your workplace or school about your diabetes treatment plan.  SEEK MEDICAL CARE IF:   You have problems keeping your blood glucose in target range.    You have blood glucose readings that are often too high or too low.   You have problems with your medicines.   You have symptoms of an illness that do not improve after 24 hours.   You have a sore or wound that is not healing.   You notice a change in vision or a new problem with your vision.   You have a fever.  MAKE SURE YOU:  Understand these instructions.   Will watch your condition.   Will get help right away if you are not doing well or get worse.  Document Released: 02/21/2000 Document Revised: 02/12/2011 Document Reviewed: 08/11/2010 [facility] Patient Information 2012 [place], [place].  If you have no primary doctor, here are some resources that may be helpful:  Medicaid-accepting [facility] Providers:   - [facility]- [address]      [phone]      Mon-Fri 9am-7pm, Sat 9am-1pm   - [facility]- [address] [place], [place]      [phone]   - [facility]- [address] [place]      [phone]   [facility]- [address]      [phone]   - Renaye Rakers- [address] [place], [address]      [phone]      Only accepts [place] patients       after they have her name applied to their card   Self Pay (no insurance) in [place]:   - Sickle Cell Patients: Dr Willey Blade, [facility]      [address] [place]      [phone]   - Health [phone]   - Physician Referral Service- [phone]   - [facility]- [address]  [phone]   [facility] - [address]   - [facility]- see information above      (Speak to [employer] if you do not have insurance)   - [facility]- [address] [place]      [phone]   - [facility] [place]- [address]      [phone]   - [facility]- [address]      [phone]   - Dr Julio Sicks-  [address], [place]      [phone]   - [facility]- [address]      [phone]   - [facility]- [address]      [phone]      Also [address]      [phone]   - [facility]- [address]      [phone]      1st and 3rd Saturday every month, 10am-1pm Other agencies that provide inexpensive medical care:    [facility]  [phone]    [facility]  [phone]    [facility]  [phone]    [facility]  [phone]    [facility]  [phone]  General Information: Finding a doctor when you do not have health insurance can be tricky. Although you are not limited by an insurance plan, you are of course limited by her finances and how much but he can pay out of pocket.  What are your options if you don't have health insurance?   1) Find a Librarian, academic and Pay Out of Pocket Although you won't have to find out who is covered by your insurance plan, it is a good idea to ask around and get recommendations. You will then need to call the office and see if the doctor you have chosen will accept you as a new patient and what types of options they offer for patients who are self-pay. Some doctors offer discounts or will set up payment plans for their patients who do not have insurance, but you will need to ask so you aren't surprised when you get to your appointment.  2) Contact Your Local Health Department Not all health departments have doctors that can see patients for sick visits, but many do, so it is worth a call to see if yours does. If you don't know where your local health department is, you can check in your phone book. The CDC also has a tool to help you locate your state's health department, and many state websites also have listings of all of their local health departments.  3) Find a Walk-in Clinic If your illness is not likely to be very severe or complicated, you may want to try  a walk in clinic. These are popping up all over the country in pharmacies, drugstores, and shopping centers. They're usually staffed by nurse practitioners or physician assistants that have been trained to treat common illnesses and complaints. They're usually fairly quick and inexpensive. However, if you have serious medical issues or chronic medical problems, these are probably not your best option

## 2011-07-19 NOTE — ED Notes (Signed)
Angel Burton, PA notified of pt's CBG. Pt given sandwhich and orange juice to eat and drink until breakfast tray arrives.

## 2011-07-19 NOTE — Progress Notes (Signed)
Cm spoke with pt concerning MD consult for medication/pcp assistance. Per MD pt with recurrent visits to ER with hyperglycemia. Cm provided pt with information concerning [facility], [facility] community care uninsured program, and [facility]. Pt given applications from [email or web address] for Novolog & Lantus. PA contacted CM on-call during the night to receive asstance with providing the pt with a vial of insulin. Pt explained eligibility of medication assistance at [facility].   Leonie Green [phone]

## 2011-07-19 NOTE — ED Notes (Signed)
Glucose level 914, lab called to inform of critical result, EDP [place] notified

## 2011-07-19 NOTE — ED Notes (Signed)
Pt shown to discharge desk and to pharmacy to pick up insulin that was arranged by Case Manager on night shift.

## 2011-07-19 NOTE — ED Provider Notes (Signed)
Medical screening examination/treatment/procedure(s) were conducted as a shared visit with non-physician practitioner(s) and myself.  I personally evaluated the patient during the encounter  Breath not ketotic. No Kussmaul respiration. Patient awake alert in no acute distress.  Hanley Seamen, MD 07/19/11 [phone]

## 2011-07-19 NOTE — ED Provider Notes (Signed)
History     CSN: [id number]  Arrival date & time 07/19/11  0148   First MD Initiated Contact with Patient 07/19/11 0356      Chief Complaint  Patient presents with  . Hyperglycemia    HPI  History provided by the patient. Patient is a 21 year old male with history of asthma and diabetes who presents with concerns for elevated blood sugar. Patient reports being out of his regular insulin for the past 2-3 months. Patient does report being seen yesterday in the emergency room for elevated blood sugars. Sugar did improve with fluids and insulin treatment at that time the patient continues to be without his regular insulin. Patient does have a Lantus flexed pain that he's been trying to supplement his insulin use for without improvement. Symptoms are associated with slight nausea and increased fatigue. Patient also notes polyuria polydipsia. Denies any additional complaints. Recent URI symptoms, no fever chills or sweats.    Past Medical History  Diagnosis Date  . Asthma   . Diabetes mellitus     History reviewed. No pertinent past surgical history.  No family history on file.  History  Substance Use Topics  . Smoking status: Not on file  . Smokeless tobacco: Not on file  . Alcohol Use: No      Review of Systems  Constitutional: Negative for fever, chills and appetite change.  Respiratory: Negative for cough and shortness of breath.   Cardiovascular: Negative for chest pain.  Gastrointestinal: Positive for nausea. Negative for vomiting, abdominal pain, diarrhea and constipation.  Genitourinary: Positive for frequency. Negative for dysuria.  Musculoskeletal: Negative for myalgias, joint swelling and arthralgias.  Skin: Negative for rash.    Allergies  Ibuprofen and Penicillins  Home Medications   Current Outpatient Rx  Name Route Sig Dispense Refill  . ALBUTEROL SULFATE HFA 108 (90 BASE) MCG/ACT IN AERS Inhalation Inhale 2 puffs into the lungs every 6 (six) hours as  needed. FOR SHORTNESS OF BREATH     . ALBUTEROL SULFATE (2.5 MG/3ML) 0.083% IN NEBU Nebulization Take 3 mLs (2.5 mg total) by nebulization every 6 (six) hours as needed for wheezing. 75 mL 12  . FLUTICASONE-SALMETEROL 250-50 MCG/DOSE IN AEPB Inhalation Inhale 1 puff into the lungs every 12 (twelve) hours.      . INSULIN ASPART 100 UNIT/ML [place] SOLN Subcutaneous Inject 2-6 Units into the skin 3 (three) times daily before meals.      . INSULIN GLARGINE 100 UNIT/ML [place] SOLN Subcutaneous Inject 8 Units into the skin at bedtime.        BP 104/63  Pulse 68  Temp(Src) 97.5 F (36.4 C) (Oral)  Resp 20  Wt 130 lb (58.968 kg)  SpO2 97%  Physical Exam  Nursing note and vitals reviewed. Constitutional: He is oriented to person, place, and time. He appears well-developed and well-nourished. No distress.  HENT:  Head: Normocephalic and atraumatic.  Eyes: Conjunctivae and EOM are normal. Pupils are equal, round, and reactive to light.  Neck: Normal range of motion. Neck supple.       No meningeal signs  Cardiovascular: Normal rate and regular rhythm.   Pulmonary/Chest: Effort normal and breath sounds normal. No respiratory distress. He has no wheezes. He has no rales.  Abdominal: Soft. He exhibits no distension. There is no tenderness. There is no rebound and no guarding.  Musculoskeletal: He exhibits no edema and no tenderness.  Neurological: He is alert and oriented to person, place, and time.  Skin: Skin is warm.  Psychiatric: He has a normal mood and affect. His behavior is normal.    ED Course  Procedures   Results for orders placed during the hospital encounter of 07/19/11  GLUCOSE, CAPILLARY      Component Value Range   Glucose-Capillary >600 (*) 70 - 99 (mg/dL)  BASIC METABOLIC PANEL      Component Value Range   Sodium 125 (*) 135 - 145 (mEq/L)   Potassium 4.1  3.5 - 5.1 (mEq/L)   Chloride 88 (*) 96 - 112 (mEq/L)   CO2 24  19 - 32 (mEq/L)   Glucose, Bld 914 (*) 70 - 99 (mg/dL)    BUN 14  6 - 23 (mg/dL)   Creatinine, Ser 4.54  0.50 - 1.35 (mg/dL)   Calcium 9.8  8.4 - 09.8 (mg/dL)   GFR calc non Af Amer >90  >90 (mL/min)   GFR calc Af Amer >90  >90 (mL/min)  GLUCOSE, CAPILLARY      Component Value Range   Glucose-Capillary >600 (*) 70 - 99 (mg/dL)        1. Hyperglycemia       MDM  Patient seen and evaluated. Patient no acute distress.  Patient appears well in no acute distress. Patient does not appear clinically indicated a. Anion gap equals 13. Will continue to try to lower blood sugar with glucose protocol.  Pharmacy is able to provide indigent prescription for patient. Patient will receive his regular insulin as well as Lantus.   Patient discussed in sign out with Trixie Dredge PAC. She'll continue to monitor her blood sugars and dispo appropriately.    Angus Seller, [place] 07/19/11 [phone]

## 2011-07-19 NOTE — ED Notes (Signed)
Pt alert, nad, arrives from home, c/o high bs, onset this evening, pt ambulates to room, steady gait noted. Speech clear, denies recent illness, c/o increased urination, increased thirst, resp even unlabored, skin pwd

## 2011-07-19 NOTE — ED Notes (Signed)
Irving Burton, PA notified of pt's CBG and at bedside. Pt A/O x4. NAD noted at this time.

## 2011-07-19 NOTE — ED Provider Notes (Signed)
Assumed care of patient from [facility], PA-C, at change of shift.  Patient with Type I diabetes out of insulin, presented with blood sugar over 900.  Not in DKA.  Patient given SQ glucose then placed on glucomander.  Plan is for patient to be discharged once blood sugar has normalized.   7:44 AM Patient's blood sugar dropped from >600 to 69.  Per protocol instructions, patient was given D50.  Patient states he is feeling fine, states he is ready to go home.  Patient has free insulin from the pharmacy through [employer].  I discussed with patient that this is only available to him once a year and stressed the importance of obtaining good follow up.  I have offered for patient to speak with case management when they come in at 8:30am.  Pt states he would prefer a list of resources and to go home.  Will recheck cbg prior to discharge.  Will discharge home with free insulin and resources.    7:54 AM Recheck cbg is 33.  Nurse gave D50 and will start D5- 1/2NS and order patient breakfast. Pt reports he continues to feel fine.  On exam, pt is A&Ox4, NAD, RRR, CTAB, abd soft, NT.  Will continue to monitor.    8:51 AM Case manager to speak with patient about community and medical resources.    Blood sugar appears to be stable.  Now.  Patient monitored, he has eaten and been given D5-1/2NS - cbg last 191.  Patient has insulin from pharmacy, resources for PCP follow up.  Pt continues to feel well.  D/C home.    Angel Barrett, [place] 07/19/11 1332

## 2011-09-05 ENCOUNTER — Emergency Department (HOSPITAL_COMMUNITY)
Admission: EM | Admit: 2011-09-05 | Discharge: 2011-09-06 | Disposition: A | Discharge status: Home / Self Care | Payer: Self-pay | Attending: Emergency Medicine | Admitting: Emergency Medicine

## 2011-09-05 ENCOUNTER — Encounter (HOSPITAL_COMMUNITY): Payer: Self-pay | Admitting: *Deleted

## 2011-09-05 DIAGNOSIS — Z79899 Other long term (current) drug therapy: Secondary | ICD-10-CM | POA: Insufficient documentation

## 2011-09-05 DIAGNOSIS — J45909 Unspecified asthma, uncomplicated: Secondary | ICD-10-CM | POA: Insufficient documentation

## 2011-09-05 DIAGNOSIS — Z794 Long term (current) use of insulin: Secondary | ICD-10-CM | POA: Insufficient documentation

## 2011-09-05 DIAGNOSIS — R739 Hyperglycemia, unspecified: Secondary | ICD-10-CM

## 2011-09-05 DIAGNOSIS — E119 Type 2 diabetes mellitus without complications: Secondary | ICD-10-CM | POA: Insufficient documentation

## 2011-09-05 LAB — CBC
HCT: 42.7 % (ref 39.0–52.0)
Hemoglobin: 16.2 g/dL (ref 13.0–17.0)
MCH: 29 pg (ref 26.0–34.0)
MCHC: 36.6 g/dL — ABNORMAL HIGH (ref 30.0–36.0)
MCV: 76.4 fL — ABNORMAL LOW (ref 78.0–100.0)
RDW: 12.2 % (ref 11.5–15.5)

## 2011-09-05 LAB — BLOOD GAS, VENOUS
Acid-Base Excess: 1.1 mmol/L (ref 0.0–2.0)
O2 Saturation: 70.3 %
TCO2: 23.4 mmol/L (ref 0–100)
pCO2, Ven: 49.4 mmHg (ref 45.0–50.0)

## 2011-09-05 LAB — POCT I-STAT, CHEM 8
BUN: 24 mg/dL — ABNORMAL HIGH (ref 6–23)
Creatinine, Ser: 1 mg/dL (ref 0.50–1.35)
Glucose, Bld: 601 mg/dL (ref 70–99)
Potassium: 4.4 mEq/L (ref 3.5–5.1)
Sodium: 131 mEq/L — ABNORMAL LOW (ref 135–145)

## 2011-09-05 LAB — URINALYSIS, ROUTINE W REFLEX MICROSCOPIC
Bilirubin Urine: NEGATIVE
Glucose, UA: >1000 mg/dL — AB
Hgb urine dipstick: NEGATIVE
Protein, ur: NEGATIVE mg/dL
Urobilinogen, UA: 0.2 mg/dL (ref 0.0–1.0)

## 2011-09-05 LAB — DIFFERENTIAL
Basophils Absolute: 0 10*3/uL (ref 0.0–0.1)
Basophils Relative: 1 % (ref 0–1)
Eosinophils Absolute: 0.2 10*3/uL (ref 0.0–0.7)
Eosinophils Relative: 3 % (ref 0–5)
Monocytes Absolute: 0.5 10*3/uL (ref 0.1–1.0)
Monocytes Relative: 9 % (ref 3–12)

## 2011-09-05 LAB — URINE MICROSCOPIC-ADD ON

## 2011-09-05 LAB — GLUCOSE, CAPILLARY

## 2011-09-05 MED ORDER — SODIUM CHLORIDE 0.9 % IV SOLN
1000.0000 mL | INTRAVENOUS | Status: DC
  Administered 2011-09-06 (×2): 1000 mL via INTRAVENOUS

## 2011-09-05 MED ORDER — SODIUM CHLORIDE 0.9 % IV SOLN
INTRAVENOUS | Status: DC
  Administered 2011-09-06: 3.4 [IU]/h via INTRAVENOUS
  Filled 2011-09-05: qty 1

## 2011-09-05 MED ORDER — SODIUM CHLORIDE 0.9 % IV SOLN
1000.0000 mL | Freq: Once | INTRAVENOUS | Status: AC
  Administered 2011-09-05: 1000 mL via INTRAVENOUS

## 2011-09-05 MED ORDER — SODIUM CHLORIDE 0.9 % IV SOLN
1000.0000 mL | Freq: Once | INTRAVENOUS | Status: AC
  Administered 2011-09-06: 1000 mL via INTRAVENOUS

## 2011-09-05 NOTE — ED Notes (Signed)
Pt out of insulin, student, limited resources and information, also, hyperglycemic above 600 on glucometer.

## 2011-09-05 NOTE — ED Provider Notes (Signed)
History     CSN: [id number]  Arrival date & time 09/05/11  2027   First MD Initiated Contact with Patient 09/05/11 2211      Chief Complaint  Patient presents with  . Hyperglycemia    The history is provided by the patient.  Pt started noticing high blood sugars on Thursday.  He ran out of his insulin today and it became worse.  Pt does not have a primary doctor.  He has been seen in the ED several times now for his diabetes.  He has not completed his applications for medicaid and has not established with a primary care doctor.  No fever, no cough.  No vomiting or diarrhea.   No pain.   Frequent urination and thirsty.  Pt has history of diabetes for 5 years.  Thinks he had DKA once at initial diagnosis.  None since.  Past Medical History  Diagnosis Date  . Asthma   . Diabetes mellitus     History reviewed. No pertinent past surgical history.  History reviewed. No pertinent family history.  History  Substance Use Topics  . Smoking status: Not on file  . Smokeless tobacco: Not on file  . Alcohol Use: No      Review of Systems  Constitutional: Negative for fever.  Respiratory: Negative for cough.   Cardiovascular: Negative for chest pain.  Genitourinary: Negative for dysuria.  All other systems reviewed and are negative.    Allergies  Ibuprofen and Penicillins  Home Medications   Current Outpatient Rx  Name Route Sig Dispense Refill  . ALBUTEROL SULFATE HFA 108 (90 BASE) MCG/ACT IN AERS Inhalation Inhale 2 puffs into the lungs every 6 (six) hours as needed. FOR SHORTNESS OF BREATH     . ALBUTEROL SULFATE (2.5 MG/3ML) 0.083% IN NEBU Nebulization Take 3 mLs (2.5 mg total) by nebulization every 6 (six) hours as needed for wheezing. 75 mL 12  . FLUTICASONE-SALMETEROL 250-50 MCG/DOSE IN AEPB Inhalation Inhale 1 puff into the lungs every 12 (twelve) hours.      . INSULIN ASPART 100 UNIT/ML [place] SOLN Subcutaneous Inject 2-6 Units into the skin 3 (three) times daily before  meals. 1 vial 3  . INSULIN GLARGINE 100 UNIT/ML [place] SOLN Subcutaneous Inject 8 Units into the skin at bedtime. 10 mL 0    BP 109/62  Pulse 79  Temp 98.7 F (37.1 C) (Oral)  Resp 18  SpO2 98%  Physical Exam  Nursing note and vitals reviewed. Constitutional: He appears well-developed and well-nourished. No distress.  HENT:  Head: Normocephalic and atraumatic.  Right Ear: External ear normal.  Left Ear: External ear normal.  Eyes: Conjunctivae are normal. Right eye exhibits no discharge. Left eye exhibits no discharge. No scleral icterus.  Neck: Neck supple. No tracheal deviation present.  Cardiovascular: Normal rate, regular rhythm and intact distal pulses.   Pulmonary/Chest: Effort normal and breath sounds normal. No stridor. No respiratory distress. He has no wheezes. He has no rales.  Abdominal: Soft. Bowel sounds are normal. He exhibits no distension. There is no tenderness. There is no rebound and no guarding.  Musculoskeletal: He exhibits no edema and no tenderness.  Neurological: He is alert. He has normal strength. No sensory deficit. Cranial nerve deficit:  no gross defecits noted. He exhibits normal muscle tone. He displays no seizure activity. Coordination normal.  Skin: Skin is warm and dry. No rash noted.  Psychiatric: He has a normal mood and affect.    ED Course  Procedures (  including critical care time)  Labs Reviewed  GLUCOSE, CAPILLARY - Abnormal; Notable for the following:    Glucose-Capillary >600 (*)     All other components within normal limits  BLOOD GAS, VENOUS - Abnormal; Notable for the following:    pH, Ven 7.358 (*)     Bicarbonate 27.1 (*)     All other components within normal limits  URINALYSIS, ROUTINE W REFLEX MICROSCOPIC - Abnormal; Notable for the following:    Specific Gravity, Urine 1.041 (*)     Glucose, UA >1000 (*)     Ketones, ur 15 (*)     All other components within normal limits  POCT I-STAT, CHEM 8 - Abnormal; Notable for the  following:    Sodium 131 (*)     Chloride 95 (*)     BUN 24 (*)     Glucose, Bld 601 (*)     All other components within normal limits  URINE MICROSCOPIC-ADD ON  CBC  DIFFERENTIAL   No results found.    MDM  Pt does not have DKA or acidosis.  Symptoms are consistent with hyperglycemia without DKA.  No complaints to suggest acute infection.  Will continue IV hydration and insulin infusion.  May be able to dc if her responds to treatment.  Counseled pt on importance of PCP follow up.        Celene Kras, MD 09/05/11 [phone]

## 2011-09-06 LAB — GLUCOSE, CAPILLARY
Glucose-Capillary: 288 mg/dL — ABNORMAL HIGH (ref 70–99)
Glucose-Capillary: 349 mg/dL — ABNORMAL HIGH (ref 70–99)
Glucose-Capillary: 397 mg/dL — ABNORMAL HIGH (ref 70–99)
Glucose-Capillary: 72 mg/dL (ref 70–99)
Glucose-Capillary: 72 mg/dL (ref 70–99)

## 2011-09-06 MED ORDER — INSULIN ASPART 100 UNIT/ML ~~LOC~~ SOLN: SUBCUTANEOUS | Status: DC

## 2011-09-06 NOTE — ED Notes (Signed)
Soda given to patient and family members.

## 2011-09-06 NOTE — ED Notes (Signed)
Patient discharge via ambulatory with a steady gait. Respirations equal and unlabored. Skin warm and dry. No acute distress noted. 

## 2011-09-06 NOTE — Discharge Instructions (Signed)
Diabetes, Frequently Asked Questions RESOURCE GUIDE  Chronic Pain Problems: Contact [facility]  [phone] Patients need to be referred by their primary care doctor.  Insufficient Money for Medicine: Contact [employer]:  call "211" or [facility] [phone].  No Primary Care Doctor: - Call [facility] Connect  [phone] - can help you locate a primary care doctor that  accepts your insurance, provides certain services, etc. - Physician Referral Service- [phone]  Agencies that provide inexpensive medical care: - [facility]  [phone] - [facility]  [phone] - [facility]  [phone] - Women's Clinic  [phone] - Planned Parenthood  [phone] [facility]  [phone]  Medicaid-accepting [facility] Providers: - [facility]- [address]  [phone], Mon-Fri 9am-7pm, Sat 9am-1pm - [facility]- [address] [place], [address] [place]  [phone] - [facility]- [address], [address] [place]  [phone] [facility]- [address]  [phone] - Renaye Rakers- [address] [place], [address], [phone]  Only accepts [place] patients after they have their name  applied to their card  Self Pay (no insurance) in [place]: - Sickle Cell Patients: Dr Willey Blade, [facility] Internal Medicine  [address] [place], [phone] - [facility] Urgent Care- [address] [place]  [phone]       [facility] [place]- [address]       -     [facility]- see information above (Speak to [employer] if you do not have insurance)       -  [facility]- [address] [place], [phone]       -  [facility]- [address],  [phone]       -  [facility]- [address], [phone]       -  Dr Julio Sicks-  [address], [place],  [phone]       -  [facility]gent Care- [address] [phone]       -  [facility]- [address] [phone], also [address] [phone]       -    [facility]- [address] [place], [phone], 1st & 3rd Saturday   every month, 10am-1pm  1) Find a Doctor and Pay Out of Pocket Although you won't have to find out who is covered by your insurance plan, it is a good idea to ask around and get recommendations. You will then need to call the office and see if the doctor you have chosen will accept you as a new patient and what types of options they offer for patients who are self-pay. Some doctors offer discounts or will set up payment plans for their patients who do not have insurance, but you will need to ask so you aren't surprised when you get to your appointment.  2) Contact Your Local Health Department Not all health departments have doctors that can see patients for sick visits, but many do, so it is worth a call to see if yours does. If you don't know where your local health department is, you can check in your phone book. The CDC also has a tool to help you locate your state's health department, and many  state websites also have listings of all of their local health departments.  3) Find a Walk-in Clinic If your illness is not likely to be very severe or complicated, you may want to try a walk in clinic. These are popping up all over the country in pharmacies, drugstores, and shopping centers. They're usually staffed by nurse practitioners or physician assistants that have been trained to treat common illnesses and complaints. They're usually fairly quick and inexpensive. However, if you have serious medical issues or chronic medical problems, these are probably not your best option  STD Testing - [facility] Department of [facility], STD Clinic, [address] [place], phone [phone] or [phone].  Monday - Friday, call for  an appointment. [facility] Department of [employer], STD Clinic, [address], [place], phone [phone] or [phone].  Monday - Friday, call for an appointment.  Abuse/Neglect: [facility] Child Abuse Hotline 2673947014 [facility] Child Abuse Hotline [phone] (After Hours)  Emergency Shelter:  [facility] [phone]  Maternity Homes: - [facility] 9297760744 - [facility] [phone]  MRSA Hotline #:   726 607 1590  [facility] Resources  [facility] [place]  [employer] [facility] Dept. [address]                 [address]         [address] [place] [address]  Blondell Reveal Phone:  [phone]                                  Phone:  [phone]                   Phone:  [phone]  [facility] Mental Health, [phone] - [phone]       -     [facility] in [place], [address],                                  [phone], [facility] Child Abuse Hotline 240-577-1816 or 631-328-9241 (After Hours)   Behavioral Health Services  Substance Abuse Resources: - Alcohol and Drug Services  [phone] - [facility] [phone] - [facility] [phone] Floydene Flock [phone] - Residential & Outpatient Substance Abuse Program  [phone]  Psychological Services: [facility]  [phone] Services  [phone] - [facility], [phone] [place]. [address] [place], ACCESS LINE: [phone] or [phone], [employer]ur[employer]an.co.za  Dental Assistance  If unable to pay or uninsured, contact:  [facility] or [facility]. to become qualified  for the adult dental clinic.  Patients with Medicaid: [facility] [phone] [address], [phone] [address],  [phone]  If unable to pay, or uninsured, contact [facility] [phone]) or [facility] Department [phone] in [place], [phone] in [facility]) to become qualified for the adult dental clinic  Other Low-Cost Community Dental Services: - [facility]- [address] [place], [place], [place], [address], [phone], [address]. [address], 2nd and 4th Thursday of the month at 6:30am.  10 clients each day by appointment, can sometimes see walk-in patients if someone does not show for an appointment. [facility]- [address] [place], [place], [address], [phone] - [facility]- [address] [place], [place], [address], [phone] [facility] Health Department- [phone] [facility] Health Department- [phone] [facility] [phone]  WHAT IS DIABETES? Most of the food we eat is turned into glucose (sugar). Our bodies use it for energy. The pancreas makes a hormone called insulin. It helps glucose get into the cells of our bodies. When you have diabetes, your body either does not make enough insulin or cannot use its own insulin as well as it should. This causes sugars to build up in your blood. WHAT ARE THE SYMPTOMS OF DIABETES?  Frequent urination.   Excessive thirst.   Unexplained weight loss.   Extreme hunger.   Blurred vision.   Tingling or numbness in hands or feet.   Feeling very tired much of the time.   Dry, itchy skin.   Sores that are slow to heal.   Yeast infections.  WHAT ARE THE TYPES OF DIABETES? Type 1 Diabetes   About 10% of affected people have this type.   Usually occurs before the age of 40.   Usually occurs in thin to normal weight people.  Type 2 Diabetes  About 90% of affected people have this type.   Usually occurs after the age of 54.    Usually occurs in overweight people.   More likely to have:   A family history of diabetes.   A history of diabetes during pregnancy (gestational diabetes).   High blood pressure.   High cholesterol and triglycerides.  Gestational Diabetes  Occurs in about 4% of pregnancies.   Usually goes away after the baby is born.   More likely to occur in women with:   Family history of diabetes.   Previous gestational diabetes.   Obese.   Over 55 years old.  WHAT IS PRE-DIABETES? Pre-diabetes means your blood glucose is higher than normal, but lower than the diabetes range. It also means you are at risk of getting type 2 diabetes and heart disease. If you are told you have pre-diabetes, have your blood glucose checked again in 1 to 2 years. WHAT IS THE TREATMENT FOR DIABETES? Treatment is aimed at keeping blood glucose near normal levels at all times. Learning how to manage this yourself is important in treating diabetes. Depending on the type of diabetes you have, your treatment will include one or more of the following:  Monitoring your blood glucose.   Meal planning.   Exercise.   Oral medicine (pills) or insulin.  CAN DIABETES BE PREVENTED? With type 1 diabetes, prevention is more difficult, because the triggers that cause it are not yet known. With type 2 diabetes, prevention is more likely, with lifestyle changes:  Maintain a healthy weight.   Eat healthy.   Exercise.  IS THERE A CURE FOR DIABETES? No, there is no cure for diabetes. There is a lot of research going on that is looking for a cure, and  progress is being made. Diabetes can be treated and controlled. People with diabetes can manage their diabetes and lead normal, active lives. SHOULD I BE TESTED FOR DIABETES? If you are at least 20 years old, you should be tested for diabetes. You should be tested again every 3 years. If you are 45 or older and overweight, you may want to get tested more often. If you are  younger than 45, overweight, and have one or more of the following risk factors, you should be tested:  Family history of diabetes.   Inactive lifestyle.   High blood pressure.  WHAT ARE SOME OTHER SOURCES FOR INFORMATION ON DIABETES? The following organizations may help in your search for more information on diabetes: National Diabetes Education Program (NDEP) Internet: SolarDiscussions.es American Diabetes Association Internet: http://www.diabetes.org  Juvenile Diabetes Foundation International Internet: WetlessWash.is Document Released: 02/26/2003 Document Revised: 02/12/2011 Document Reviewed: 12/21/2008 [facility] Patient Information 2012 [place], [place].Hyperglycemia Hyperglycemia occurs when the glucose (sugar) in your blood is too high. Hyperglycemia can happen for many reasons, but it most often happens to people who do not know they have diabetes or are not managing their diabetes properly.  CAUSES  Whether you have diabetes or not, there are other causes of hyperglycemia. Hyperglycemia can occur when you have diabetes, but it can also occur in other situations that you might not be as aware of, such as: Diabetes  If you have diabetes and are having problems controlling your blood glucose, hyperglycemia could occur because of some of the following reasons:   Not following your meal plan.   Not taking your diabetes medications or not taking it properly.   Exercising less or doing less activity than you normally do.   Being sick.  Pre-diabetes  This cannot be ignored. Before people develop Type 2 diabetes, they almost always have "pre-diabetes." This is when your blood glucose levels are higher than normal, but not yet high enough to be diagnosed as diabetes. Research has shown that some long-term damage to the body, especially the heart and circulatory system, may already be occurring during pre-diabetes. If you take action to manage your blood glucose when  you have pre-diabetes, you may delay or prevent Type 2 diabetes from developing.  Stress  If you have diabetes, you may be "diet" controlled or on oral medications or insulin to control your diabetes. However, you may find that your blood glucose is higher than usual in the hospital whether you have diabetes or not. This is often referred to as "stress hyperglycemia." Stress can elevate your blood glucose. This happens because of hormones put out by the body during times of stress. If stress has been the cause of your high blood glucose, it can be followed regularly by your caregiver. That way he/she can make sure your hyperglycemia does not continue to get worse or progress to diabetes.  Steroids  Steroids are medications that act on the infection fighting system (immune system) to block inflammation or infection. One side effect can be a rise in blood glucose. Most people can produce enough extra insulin to allow for this rise, but for those who cannot, steroids make blood glucose levels go even higher. It is not unusual for steroid treatments to "uncover" diabetes that is developing. It is not always possible to determine if the hyperglycemia will go away after the steroids are stopped. A special blood test called an A1c is sometimes done to determine if your blood glucose was elevated before the steroids were started.  SYMPTOMS  Thirsty.   Frequent urination.   Dry mouth.   Blurred vision.   Tired or fatigue.   Weakness.   Sleepy.   Tingling in feet or leg.  DIAGNOSIS  Diagnosis is made by monitoring blood glucose in one or all of the following ways:  A1c test. This is a chemical found in your blood.   Fingerstick blood glucose monitoring.   Laboratory results.  TREATMENT  First, knowing the cause of the hyperglycemia is important before the hyperglycemia can be treated. Treatment may include, but is not be limited to:  Education.   Change or adjustment in medications.    Change or adjustment in meal plan.   Treatment for an illness, infection, etc.   More frequent blood glucose monitoring.   Change in exercise plan.   Decreasing or stopping steroids.   Lifestyle changes.  HOME CARE INSTRUCTIONS   Test your blood glucose as directed.   Exercise regularly. Your caregiver will give you instructions about exercise. Pre-diabetes or diabetes which comes on with stress is helped by exercising.   Eat wholesome, balanced meals. Eat often and at regular, fixed times. Your caregiver or nutritionist will give you a meal plan to guide your sugar intake.   Being at an ideal weight is important. If needed, losing as little as 10 to 15 pounds may help improve blood glucose levels.  SEEK MEDICAL CARE IF:   You have questions about medicine, activity, or diet.   You continue to have symptoms (problems such as increased thirst, urination, or weight gain).  SEEK IMMEDIATE MEDICAL CARE IF:   You are vomiting or have diarrhea.   Your breath smells fruity.   You are breathing faster or slower.   You are very sleepy or incoherent.   You have numbness, tingling, or pain in your feet or hands.   You have chest pain.   Your symptoms get worse even though you have been following your caregiver's orders.   If you have any other questions or concerns.  Document Released: 08/19/2000 Document Revised: 02/12/2011 Document Reviewed: 10/15/2008 [facility] Patient Information 2012 [place], [place].

## 2011-10-06 ENCOUNTER — Encounter (HOSPITAL_COMMUNITY): Payer: Self-pay | Admitting: Emergency Medicine

## 2011-10-06 ENCOUNTER — Emergency Department (HOSPITAL_COMMUNITY)
Admission: EM | Admit: 2011-10-06 | Discharge: 2011-10-06 | Disposition: A | Discharge status: Home / Self Care | Payer: Self-pay | Attending: Emergency Medicine | Admitting: Emergency Medicine

## 2011-10-06 DIAGNOSIS — E119 Type 2 diabetes mellitus without complications: Secondary | ICD-10-CM | POA: Insufficient documentation

## 2011-10-06 DIAGNOSIS — J45909 Unspecified asthma, uncomplicated: Secondary | ICD-10-CM | POA: Insufficient documentation

## 2011-10-06 DIAGNOSIS — Z880 Allergy status to penicillin: Secondary | ICD-10-CM | POA: Insufficient documentation

## 2011-10-06 DIAGNOSIS — A6000 Herpesviral infection of urogenital system, unspecified: Secondary | ICD-10-CM | POA: Insufficient documentation

## 2011-10-06 MED ORDER — ACYCLOVIR 400 MG PO TABS: 800.0000 mg | ORAL_TABLET | Freq: Every day | ORAL | Status: DC

## 2011-10-06 NOTE — ED Provider Notes (Signed)
History     CSN: [id number]  Arrival date & time 10/06/11  0504   First MD Initiated Contact with Patient 10/06/11 0532      Chief Complaint  Patient presents with  . Penis Injury    (Consider location/radiation/quality/duration/timing/severity/associated sxs/prior treatment) HPI Comments: 21 year old male with a history of sexual promiscuity per the patient's report during which she is not using protection. He states that he has had several encounters with different people after which he is developed penile urethral discharge as well as painful lesions on the tip of his penis. He states that these come and go, he last had these lesions 5 months ago at which time they spontaneously resolved. At this time he denies any penile discharge but just has painful lesions. They have been present for several days, there are persistent, worse with palpation, denies abdominal pain fever or testicular pain or swelling.  The history is provided by the patient and medical records.    Past Medical History  Diagnosis Date  . Asthma   . Diabetes mellitus     History reviewed. No pertinent past surgical history.  Family History  Problem Relation Age of Onset  . Diabetes Other     History  Substance Use Topics  . Smoking status: Not on file  . Smokeless tobacco: Not on file  . Alcohol Use: No      Review of Systems  Gastrointestinal: Negative for nausea, vomiting and abdominal pain.  Genitourinary: Positive for penile pain. Negative for discharge.    Allergies  Ibuprofen and Penicillins  Home Medications   Current Outpatient Rx  Name Route Sig Dispense Refill  . ALBUTEROL SULFATE HFA 108 (90 BASE) MCG/ACT IN AERS Inhalation Inhale 2 puffs into the lungs every 6 (six) hours as needed. FOR SHORTNESS OF BREATH     . ALBUTEROL SULFATE (2.5 MG/3ML) 0.083% IN NEBU Nebulization Take 3 mLs (2.5 mg total) by nebulization every 6 (six) hours as needed for wheezing. 75 mL 12  .  FLUTICASONE-SALMETEROL 250-50 MCG/DOSE IN AEPB Inhalation Inhale 1 puff into the lungs every 12 (twelve) hours.      . INSULIN ASPART 100 UNIT/ML [place] SOLN  Sliding scale as directed by your doctor 1 vial 0  . INSULIN GLARGINE 100 UNIT/ML [place] SOLN Subcutaneous Inject 8 Units into the skin at bedtime. 10 mL 0  . ACYCLOVIR 400 MG PO TABS Oral Take 2 tablets (800 mg total) by mouth 5 (five) times daily. 50 tablet 2    BP 96/63  Pulse 80  Temp 97.8 F (36.6 C) (Oral)  Resp 18  SpO2 100%  Physical Exam  Nursing note and vitals reviewed. Constitutional: He appears well-developed and well-nourished. No distress.  HENT:  Head: Normocephalic and atraumatic.  Eyes: Conjunctivae are normal. No scleral icterus.  Pulmonary/Chest: Effort normal.  Abdominal: Soft. There is no tenderness.  Genitourinary:       Uncircumcised penis with several ulcer or-type lesions surrounding the corona and foreskin, normal appearing scrotum and testicles, no urethral discharge  Musculoskeletal: He exhibits no edema and no tenderness.  Neurological: He is alert. Coordination normal.    ED Course  Procedures (including critical care time)   Labs Reviewed  GC/CHLAMYDIA PROBE AMP, GENITAL   No results found.   1. Genital herpes       MDM  Consistent with genital herpes exacerbation, acyclovir prescribed, patient given STD clinic followup for further testing including HIV, GC Chlamydia swab sent, at this time patient can be  contacted by phone for positive results, no other antibiotics other than acyclovir prescribed at this time.  Discharge Prescriptions include:  acyclovir        Vida Roller, MD 10/06/11 [phone]

## 2011-10-06 NOTE — ED Notes (Signed)
Pt reports having unprotected sex 4-5 months ago. Weeks later pt started to notice lesions around his penis. Lesions are tender to palpation

## 2011-10-06 NOTE — ED Notes (Signed)
Pt states he had sexual intercoarse with a male  States states a couple weeks later he noted some small open areas around the end of his penis  Pt states he put some ointment on it and did not think that much about it   Pt states now when he has intercoarse with his current partner the areas reappear

## 2011-10-07 LAB — GC/CHLAMYDIA PROBE AMP, GENITAL: GC Probe Amp, Genital: NEGATIVE

## 2011-10-08 ENCOUNTER — Emergency Department (HOSPITAL_COMMUNITY)
Admission: EM | Admit: 2011-10-08 | Discharge: 2011-10-08 | Disposition: A | Discharge status: Home / Self Care | Payer: Self-pay | Attending: Emergency Medicine | Admitting: Emergency Medicine

## 2011-10-08 ENCOUNTER — Encounter (HOSPITAL_COMMUNITY): Payer: Self-pay | Admitting: Emergency Medicine

## 2011-10-08 DIAGNOSIS — Z794 Long term (current) use of insulin: Secondary | ICD-10-CM | POA: Insufficient documentation

## 2011-10-08 DIAGNOSIS — N4889 Other specified disorders of penis: Secondary | ICD-10-CM | POA: Insufficient documentation

## 2011-10-08 DIAGNOSIS — E119 Type 2 diabetes mellitus without complications: Secondary | ICD-10-CM | POA: Insufficient documentation

## 2011-10-08 NOTE — ED Notes (Addendum)
Reports cracks at head of penis and inflammation with white discharge; recently has had unprotected sex; denies dysuria; reports does not have BS meter--on novolog insulin-last took earlier this year, now reports taking otc insulin- and is running low; BS 405 at triage a/w polydispia, polyuria

## 2011-10-08 NOTE — ED Notes (Signed)
Called for pt in waiting room and triage waiting with no answer

## 2011-12-06 ENCOUNTER — Observation Stay (HOSPITAL_COMMUNITY)
Admission: EM | Admit: 2011-12-06 | Discharge: 2011-12-07 | Disposition: A | Discharge status: Home / Self Care | Payer: Self-pay | Attending: Emergency Medicine | Admitting: Emergency Medicine

## 2011-12-06 ENCOUNTER — Encounter (HOSPITAL_COMMUNITY): Payer: Self-pay

## 2011-12-06 DIAGNOSIS — R739 Hyperglycemia, unspecified: Secondary | ICD-10-CM

## 2011-12-06 DIAGNOSIS — E109 Type 1 diabetes mellitus without complications: Principal | ICD-10-CM | POA: Insufficient documentation

## 2011-12-06 DIAGNOSIS — J45909 Unspecified asthma, uncomplicated: Secondary | ICD-10-CM | POA: Insufficient documentation

## 2011-12-06 DIAGNOSIS — N476 Balanoposthitis: Secondary | ICD-10-CM | POA: Insufficient documentation

## 2011-12-06 DIAGNOSIS — N481 Balanitis: Secondary | ICD-10-CM

## 2011-12-06 LAB — CBC
HCT: 43.4 % (ref 39.0–52.0)
Hemoglobin: 16.2 g/dL (ref 13.0–17.0)
MCV: 77.2 fL — ABNORMAL LOW (ref 78.0–100.0)
RBC: 5.62 MIL/uL (ref 4.22–5.81)
WBC: 7.8 10*3/uL (ref 4.0–10.5)

## 2011-12-06 LAB — BLOOD GAS, VENOUS
Acid-Base Excess: 3.3 mmol/L — ABNORMAL HIGH (ref 0.0–2.0)
Patient temperature: 98.6
TCO2: 24.6 mmol/L (ref 0–100)
pCO2, Ven: 47.6 mmHg (ref 45.0–50.0)
pH, Ven: 7.396 — ABNORMAL HIGH (ref 7.250–7.300)

## 2011-12-06 LAB — GLUCOSE, CAPILLARY
Glucose-Capillary: 328 mg/dL — ABNORMAL HIGH (ref 70–99)
Glucose-Capillary: 338 mg/dL — ABNORMAL HIGH (ref 70–99)

## 2011-12-06 LAB — URINALYSIS, ROUTINE W REFLEX MICROSCOPIC
Bilirubin Urine: NEGATIVE
Glucose, UA: >1000 mg/dL — AB
Hgb urine dipstick: NEGATIVE
Specific Gravity, Urine: >1.046 — ABNORMAL HIGH (ref 1.005–1.030)
Urobilinogen, UA: 0.2 mg/dL (ref 0.0–1.0)
pH: 6 (ref 5.0–8.0)

## 2011-12-06 LAB — COMPREHENSIVE METABOLIC PANEL
Albumin: 4.5 g/dL (ref 3.5–5.2)
Alkaline Phosphatase: 116 U/L (ref 39–117)
BUN: 13 mg/dL (ref 6–23)
Calcium: 9.9 mg/dL (ref 8.4–10.5)
Creatinine, Ser: 0.89 mg/dL (ref 0.50–1.35)
GFR calc Af Amer: >90 mL/min (ref >90)
Glucose, Bld: 350 mg/dL — ABNORMAL HIGH (ref 70–99)
Potassium: 3.8 mEq/L (ref 3.5–5.1)
Total Protein: 7.9 g/dL (ref 6.0–8.3)

## 2011-12-06 LAB — URINE MICROSCOPIC-ADD ON

## 2011-12-06 MED ORDER — POTASSIUM CHLORIDE 10 MEQ/100ML IV SOLN: 10.0000 meq | INTRAVENOUS | Status: AC

## 2011-12-06 MED ORDER — INSULIN REGULAR HUMAN 100 UNIT/ML IJ SOLN: 8.0000 [IU] | Freq: Once | INTRAMUSCULAR | Status: DC

## 2011-12-06 MED ORDER — CLOTRIMAZOLE 1 % EX CREA
TOPICAL_CREAM | Freq: Once | CUTANEOUS | Status: AC
  Administered 2011-12-07: 1 via TOPICAL
  Filled 2011-12-06: qty 15

## 2011-12-06 MED ORDER — SODIUM CHLORIDE 0.9 % IV SOLN
INTRAVENOUS | Status: AC
  Administered 2011-12-06: via INTRAVENOUS

## 2011-12-06 MED ORDER — SODIUM CHLORIDE 0.9 % IV BOLUS (SEPSIS)
1000.0000 mL | Freq: Once | INTRAVENOUS | Status: AC
  Administered 2011-12-06: 1000 mL via INTRAVENOUS

## 2011-12-06 MED ORDER — SODIUM CHLORIDE 0.9 % IV SOLN: INTRAVENOUS | Status: DC

## 2011-12-06 MED ORDER — SODIUM CHLORIDE 0.9 % IV SOLN
INTRAVENOUS | Status: DC
  Administered 2011-12-06: 2.8 [IU]/h via INTRAVENOUS
  Filled 2011-12-06: qty 1

## 2011-12-06 MED ORDER — SODIUM CHLORIDE 0.9 % IV SOLN
INTRAVENOUS | Status: DC
  Filled 2011-12-06: qty 1

## 2011-12-06 MED ORDER — INSULIN ASPART 100 UNIT/ML ~~LOC~~ SOLN: 8.0000 [IU] | Freq: Once | SUBCUTANEOUS | Status: DC

## 2011-12-06 NOTE — ED Notes (Signed)
Pt presents with no acute distress.  Pt c/o of weakness, urinary frequency since 1pm today Inflammation to skin around penis has been chronic

## 2011-12-06 NOTE — ED Provider Notes (Signed)
History     CSN: [id number]  Arrival date & time 12/06/11  4098   First MD Initiated Contact with Patient 12/06/11 2040      Chief Complaint  Patient presents with  . Hyperglycemia  . URI  . Wound Check    inflammation around the tip of penis    (Consider location/radiation/quality/duration/timing/severity/associated sxs/prior treatment) HPI Comments: Ms. Angel Barrett is a 21 year old insulin-dependent type I diabetic, who states he uses over-the-counter" insulin that he ran out of last night.  He does not check his sugars at all.  States he knows when his sugar is high or low by his physical presentation.  Presents today most concerning for inflammation of the head of his penis which has been a chronic issue for him.  He states it is worse tonight with the skin and cracking and painful.  He does not have a primary care physician.  Patient is a 21 y.o. male presenting with URI and wound check. The history is provided by the patient.  URI Primary symptoms do not include fever, headaches, swollen glands, nausea, vomiting, myalgias or rash.  The illness is not associated with rhinorrhea.  Wound Check     Past Medical History  Diagnosis Date  . Asthma   . Diabetes mellitus     History reviewed. No pertinent past surgical history.  Family History  Problem Relation Age of Onset  . Diabetes Other     History  Substance Use Topics  . Smoking status: Former Smoker    Quit date: 10/08/2010  . Smokeless tobacco: Not on file  . Alcohol Use: No      Review of Systems  Constitutional: Negative for fever and appetite change.  HENT: Negative for rhinorrhea.   Gastrointestinal: Negative for nausea and vomiting.  Genitourinary: Positive for frequency and penile swelling. Negative for dysuria and testicular pain.  Musculoskeletal: Negative for myalgias.  Skin: Negative for rash.  Neurological: Negative for dizziness, weakness and headaches.    Allergies  Ibuprofen and  Penicillins  Home Medications   Current Outpatient Rx  Name Route Sig Dispense Refill  . ALBUTEROL SULFATE HFA 108 (90 BASE) MCG/ACT IN AERS Inhalation Inhale 2 puffs into the lungs every 6 (six) hours as needed. For shortness of breath.    Marland Kitchen FLUTICASONE-SALMETEROL 250-50 MCG/DOSE IN AEPB Inhalation Inhale 1 puff into the lungs every 12 (twelve) hours.      . INSULIN GLARGINE 100 UNIT/ML [place] SOLN Subcutaneous Inject 8 Units into the skin at bedtime.    Marland Kitchen CLOTRIMAZOLE 1 % EX CREA Topical Apply topically 2 (two) times daily. 30 g 0    BP 95/52  Pulse 53  Temp 97.7 F (36.5 C) (Oral)  Resp 16  SpO2 100%  Physical Exam  Constitutional: He appears well-developed. He appears distressed.  HENT:  Head: Normocephalic.  Eyes: Pupils are equal, round, and reactive to light.  Neck: Normal range of motion.  Cardiovascular: Normal rate.   Pulmonary/Chest: Effort normal.  Abdominal: Soft. He exhibits no distension.  Genitourinary: Uncircumcised. Penile tenderness present.       foreskin is tight thick white crusting drainage under foreskin and around head of glans  Unable to completely retract foreskin   Musculoskeletal: Normal range of motion.  Neurological: He is alert.  Skin: Skin is warm.    ED Course  Procedures (including critical care time)  Labs Reviewed  GLUCOSE, CAPILLARY - Abnormal; Notable for the following:    Glucose-Capillary 338 (*)     All  other components within normal limits  COMPREHENSIVE METABOLIC PANEL - Abnormal; Notable for the following:    Chloride 95 (*)     Glucose, Bld 350 (*)     All other components within normal limits  CBC - Abnormal; Notable for the following:    MCV 77.2 (*)     MCHC 37.3 (*)     All other components within normal limits  URINALYSIS, ROUTINE W REFLEX MICROSCOPIC - Abnormal; Notable for the following:    Specific Gravity, Urine >1.046 (*)     Glucose, UA >1000 (*)     All other components within normal limits  BLOOD GAS, VENOUS  - Abnormal; Notable for the following:    pH, Ven 7.396 (*)     Bicarbonate 28.6 (*)     Acid-Base Excess 3.3 (*)     All other components within normal limits  GLUCOSE, CAPILLARY - Abnormal; Notable for the following:    Glucose-Capillary 328 (*)     All other components within normal limits  GLUCOSE, CAPILLARY - Abnormal; Notable for the following:    Glucose-Capillary 338 (*)     All other components within normal limits  GLUCOSE, CAPILLARY - Abnormal; Notable for the following:    Glucose-Capillary 254 (*)     All other components within normal limits  GLUCOSE, CAPILLARY - Abnormal; Notable for the following:    Glucose-Capillary 144 (*)     All other components within normal limits  URINE MICROSCOPIC-ADD ON   No results found.   1. Hyperglycemia   2. Balanitis       MDM  Hyperglycemia, and balanitis.  Patient states he can feel his insulin tomorrow.  I have also prescribed Lotrimin cream to be applied under the foreskin twice a day for the next 7 days.  Refer the patient to urology for followup        Arman Filter, NP 12/07/11 [phone]

## 2011-12-06 NOTE — ED Notes (Signed)
Pt reports complaint with meds

## 2011-12-07 LAB — GLUCOSE, CAPILLARY: Glucose-Capillary: 126 mg/dL — ABNORMAL HIGH (ref 70–99)

## 2011-12-07 MED ORDER — CLOTRIMAZOLE 1 % EX CREA: TOPICAL_CREAM | Freq: Two times a day (BID) | CUTANEOUS | Status: DC

## 2011-12-08 NOTE — ED Provider Notes (Signed)
Medical screening examination/treatment/procedure(s) were performed by non-physician practitioner and as supervising physician I was immediately available for consultation/collaboration.   Gerhard Munch, MD 12/08/11 0004

## 2011-12-12 ENCOUNTER — Encounter (HOSPITAL_COMMUNITY): Payer: Self-pay | Admitting: *Deleted

## 2011-12-12 ENCOUNTER — Emergency Department (HOSPITAL_COMMUNITY)
Admission: EM | Admit: 2011-12-12 | Discharge: 2011-12-13 | Disposition: A | Discharge status: Home / Self Care | Payer: Self-pay | Attending: Emergency Medicine | Admitting: Emergency Medicine

## 2011-12-12 ENCOUNTER — Emergency Department (HOSPITAL_COMMUNITY): Payer: Self-pay

## 2011-12-12 DIAGNOSIS — R739 Hyperglycemia, unspecified: Secondary | ICD-10-CM

## 2011-12-12 DIAGNOSIS — J45909 Unspecified asthma, uncomplicated: Secondary | ICD-10-CM | POA: Insufficient documentation

## 2011-12-12 DIAGNOSIS — S0003XA Contusion of scalp, initial encounter: Secondary | ICD-10-CM | POA: Insufficient documentation

## 2011-12-12 DIAGNOSIS — Z794 Long term (current) use of insulin: Secondary | ICD-10-CM | POA: Insufficient documentation

## 2011-12-12 DIAGNOSIS — Z9114 Patient's other noncompliance with medication regimen: Secondary | ICD-10-CM

## 2011-12-12 DIAGNOSIS — E1169 Type 2 diabetes mellitus with other specified complication: Secondary | ICD-10-CM | POA: Insufficient documentation

## 2011-12-12 DIAGNOSIS — Z9119 Patient's noncompliance with other medical treatment and regimen: Secondary | ICD-10-CM | POA: Insufficient documentation

## 2011-12-12 DIAGNOSIS — S5010XA Contusion of unspecified forearm, initial encounter: Secondary | ICD-10-CM | POA: Insufficient documentation

## 2011-12-12 DIAGNOSIS — Z91199 Patient's noncompliance with other medical treatment and regimen due to unspecified reason: Secondary | ICD-10-CM | POA: Insufficient documentation

## 2011-12-12 LAB — CBC WITH DIFFERENTIAL/PLATELET
Basophils Absolute: 0.1 10*3/uL (ref 0.0–0.1)
HCT: 42.4 % (ref 39.0–52.0)
Hemoglobin: 15.5 g/dL (ref 13.0–17.0)
Lymphocytes Relative: 30 % (ref 12–46)
Monocytes Absolute: 0.7 10*3/uL (ref 0.1–1.0)
Neutro Abs: 5.8 10*3/uL (ref 1.7–7.7)
RDW: 12.6 % (ref 11.5–15.5)
WBC: 9.8 10*3/uL (ref 4.0–10.5)

## 2011-12-12 LAB — GLUCOSE, CAPILLARY: Glucose-Capillary: 488 mg/dL — ABNORMAL HIGH (ref 70–99)
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
## 2011-12-13 ENCOUNTER — Emergency Department (HOSPITAL_COMMUNITY): Payer: Self-pay

## 2011-12-13 LAB — COMPREHENSIVE METABOLIC PANEL
ALT: 35 U/L (ref 0–53)
AST: 31 U/L (ref 0–37)
Alkaline Phosphatase: 105 U/L (ref 39–117)
CO2: 27 mEq/L (ref 19–32)
Chloride: 94 mEq/L — ABNORMAL LOW (ref 96–112)
Creatinine, Ser: 1.01 mg/dL (ref 0.50–1.35)
GFR calc non Af Amer: >90 mL/min (ref >90)
Potassium: 4.2 mEq/L (ref 3.5–5.1)
Total Bilirubin: 0.6 mg/dL (ref 0.3–1.2)

## 2011-12-13 LAB — URINALYSIS, ROUTINE W REFLEX MICROSCOPIC
Bilirubin Urine: NEGATIVE
Hgb urine dipstick: NEGATIVE
Ketones, ur: NEGATIVE mg/dL
Protein, ur: NEGATIVE mg/dL
Urobilinogen, UA: 0.2 mg/dL (ref 0.0–1.0)

## 2011-12-13 LAB — GLUCOSE, CAPILLARY: Glucose-Capillary: 331 mg/dL — ABNORMAL HIGH (ref 70–99)

## 2011-12-13 MED ORDER — INSULIN ASPART 100 UNIT/ML ~~LOC~~ SOLN
10.0000 [IU] | Freq: Once | SUBCUTANEOUS | Status: AC
  Administered 2011-12-13: 10 [IU] via SUBCUTANEOUS
  Filled 2011-12-13: qty 1

## 2011-12-13 MED ORDER — HYDROCODONE-ACETAMINOPHEN 5-500 MG PO TABS: 1.0000 | ORAL_TABLET | Freq: Four times a day (QID) | ORAL | Status: DC | PRN

## 2011-12-13 MED ORDER — SODIUM CHLORIDE 0.9 % IV BOLUS (SEPSIS)
1000.0000 mL | Freq: Once | INTRAVENOUS | Status: AC
  Administered 2011-12-13: 1000 mL via INTRAVENOUS

## 2011-12-13 MED ORDER — INSULIN GLARGINE 100 UNIT/ML ~~LOC~~ SOLN
8.0000 [IU] | Freq: Once | SUBCUTANEOUS | Status: AC
  Administered 2011-12-13: 8 [IU] via SUBCUTANEOUS
  Filled 2011-12-13: qty 1

## 2011-12-13 NOTE — ED Provider Notes (Signed)
Medical screening examination/treatment/procedure(s) were performed by non-physician practitioner and as supervising physician I was immediately available for consultation/collaboration.  Olivia Mackie, MD 12/13/11 [phone]
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
## 2011-12-14 LAB — GLUCOSE, CAPILLARY: Glucose-Capillary: 163 mg/dL — ABNORMAL HIGH (ref 70–99)

## 2011-12-24 ENCOUNTER — Encounter (HOSPITAL_COMMUNITY): Payer: Self-pay

## 2011-12-24 ENCOUNTER — Emergency Department (HOSPITAL_COMMUNITY)
Admission: EM | Admit: 2011-12-24 | Discharge: 2011-12-24 | Disposition: A | Discharge status: Home / Self Care | Payer: Self-pay | Attending: Emergency Medicine | Admitting: Emergency Medicine

## 2011-12-24 DIAGNOSIS — R7309 Other abnormal glucose: Secondary | ICD-10-CM | POA: Insufficient documentation

## 2011-12-24 DIAGNOSIS — K089 Disorder of teeth and supporting structures, unspecified: Secondary | ICD-10-CM | POA: Insufficient documentation

## 2011-12-24 DIAGNOSIS — J45909 Unspecified asthma, uncomplicated: Secondary | ICD-10-CM | POA: Insufficient documentation

## 2011-12-24 DIAGNOSIS — R739 Hyperglycemia, unspecified: Secondary | ICD-10-CM

## 2011-12-24 DIAGNOSIS — K0889 Other specified disorders of teeth and supporting structures: Secondary | ICD-10-CM

## 2011-12-24 DIAGNOSIS — Z888 Allergy status to other drugs, medicaments and biological substances status: Secondary | ICD-10-CM | POA: Insufficient documentation

## 2011-12-24 DIAGNOSIS — Z87891 Personal history of nicotine dependence: Secondary | ICD-10-CM | POA: Insufficient documentation

## 2011-12-24 LAB — BLOOD GAS, VENOUS
Bicarbonate: 24.2 mEq/L — ABNORMAL HIGH (ref 20.0–24.0)
FIO2: 0.21 %
O2 Saturation: 93.2 %
pCO2, Ven: 39 mmHg — ABNORMAL LOW (ref 45.0–50.0)
pO2, Ven: 61.9 mmHg — ABNORMAL HIGH (ref 30.0–45.0)

## 2011-12-24 LAB — URINALYSIS, ROUTINE W REFLEX MICROSCOPIC
Glucose, UA: >1000 mg/dL — AB
Hgb urine dipstick: NEGATIVE
Leukocytes, UA: NEGATIVE
Protein, ur: NEGATIVE mg/dL
pH: 6.5 (ref 5.0–8.0)

## 2011-12-24 LAB — GLUCOSE, CAPILLARY: Glucose-Capillary: 505 mg/dL — ABNORMAL HIGH (ref 70–99)

## 2011-12-24 LAB — COMPREHENSIVE METABOLIC PANEL
AST: 29 U/L (ref 0–37)
Albumin: 3.5 g/dL (ref 3.5–5.2)
Alkaline Phosphatase: 89 U/L (ref 39–117)
Chloride: 96 mEq/L (ref 96–112)
Creatinine, Ser: 0.94 mg/dL (ref 0.50–1.35)
Potassium: 3.6 mEq/L (ref 3.5–5.1)
Total Bilirubin: 0.5 mg/dL (ref 0.3–1.2)
Total Protein: 6.1 g/dL (ref 6.0–8.3)

## 2011-12-24 LAB — URINE MICROSCOPIC-ADD ON

## 2011-12-24 LAB — CBC
MCHC: 36.1 g/dL — ABNORMAL HIGH (ref 30.0–36.0)
Platelets: 204 10*3/uL (ref 150–400)
RDW: 12.5 % (ref 11.5–15.5)
WBC: 6.4 10*3/uL (ref 4.0–10.5)

## 2011-12-24 MED ORDER — TRAMADOL HCL 50 MG PO TABS: 50.0000 mg | ORAL_TABLET | Freq: Four times a day (QID) | ORAL | Status: DC | PRN

## 2011-12-24 MED ORDER — INSULIN ASPART 100 UNIT/ML ~~LOC~~ SOLN
10.0000 [IU] | Freq: Once | SUBCUTANEOUS | Status: AC
  Administered 2011-12-24: 10 [IU] via SUBCUTANEOUS

## 2011-12-24 MED ORDER — SODIUM CHLORIDE 0.9 % IV SOLN
Freq: Once | INTRAVENOUS | Status: AC
  Administered 2011-12-24: 06:00:00 via INTRAVENOUS

## 2011-12-24 MED ORDER — CLINDAMYCIN HCL 300 MG PO CAPS: 300.0000 mg | ORAL_CAPSULE | Freq: Three times a day (TID) | ORAL | Status: DC

## 2011-12-24 NOTE — ED Notes (Signed)
Upgraded chief complaint and acuity based on cbg results.

## 2011-12-24 NOTE — ED Notes (Signed)
Per pt, dental pain started 2 days ago.  No dentist.  Pain uncontrolled.  Pt took advil/aspirin at home with no relief.

## 2011-12-24 NOTE — ED Notes (Signed)
Pt is also a type I diabetic.  States he does not have a meter and does not check his blood sugars.  Sliding scale and Lantus.  Bases sliding scale on food intake.

## 2011-12-24 NOTE — ED Provider Notes (Signed)
History     CSN: [id number] Arrival date & time 12/24/11  0453 First MD Initiated Contact with Patient 12/24/11 0531      Chief Complaint  Patient presents with  . Dental Pain  . Hyperglycemia    HPI The patient presents to the emergency room with complaints of dental pain. It started about 2 days ago. He's been trying Advil and aspirin without relief. The pain increases with chewing. He has not had any drainage or any swelling.  Patient also has history of diabetes. He treats himself with insulin. He also uses a sliding scale however he does not check his blood sugar and he just comes carbohydrates. The patient has been living in this area for about one year but does not have a primary doctor or endocrinologist that he follows. He denies any nausea vomiting or fevers. He denies any other complaints today. He has been using his medications regularly Past Medical History  Diagnosis Date  . Asthma   . Diabetes mellitus     History reviewed. No pertinent past surgical history.  Family History  Problem Relation Age of Onset  . Diabetes Other     History  Substance Use Topics  . Smoking status: Former Smoker    Quit date: 10/08/2010  . Smokeless tobacco: Not on file  . Alcohol Use: No      Review of Systems  All other systems reviewed and are negative.    Allergies  Ibuprofen and Penicillins  Home Medications   Current Outpatient Rx  Name Route Sig Dispense Refill  . ALBUTEROL SULFATE HFA 108 (90 BASE) MCG/ACT IN AERS Inhalation Inhale 2 puffs into the lungs every 6 (six) hours as needed. For shortness of breath.    Marland Kitchen FLUTICASONE-SALMETEROL 250-50 MCG/DOSE IN AEPB Inhalation Inhale 1 puff into the lungs every 12 (twelve) hours.    . INSULIN GLARGINE 100 UNIT/ML [place] SOLN Subcutaneous Inject 8 Units into the skin at bedtime.    . INSULIN REGULAR HUMAN 100 UNIT/ML [place] SOLN Subcutaneous Inject 5-10 Units into the skin 3 (three) times daily before meals. Sliding scale       BP 106/61  Pulse 91  Temp 97.6 F (36.4 C) (Oral)  Resp 16  SpO2 99%  Physical Exam  Nursing note and vitals reviewed. Constitutional: He appears well-developed and well-nourished. No distress.  HENT:  Head: Normocephalic and atraumatic.  Right Ear: External ear normal.  Left Ear: External ear normal.       No dental swelling, no obvious dental caries, palpation of the right lower molar region  Eyes: Conjunctivae normal are normal. Right eye exhibits no discharge. Left eye exhibits no discharge. No scleral icterus.  Neck: Neck supple. No tracheal deviation present.  Cardiovascular: Normal rate, regular rhythm and intact distal pulses.   Pulmonary/Chest: Effort normal and breath sounds normal. No stridor. No respiratory distress. He has no wheezes. He has no rales.  Abdominal: Soft. Bowel sounds are normal. He exhibits no distension. There is no tenderness. There is no rebound and no guarding.  Musculoskeletal: He exhibits no edema and no tenderness.  Lymphadenopathy:    He has no cervical adenopathy.  Neurological: He is alert. He has normal strength. No sensory deficit. Cranial nerve deficit:  no gross defecits noted. He exhibits normal muscle tone. He displays no seizure activity. Coordination normal.  Skin: Skin is warm and dry. No rash noted.  Psychiatric: He has a normal mood and affect.    ED Course  Procedures (including  critical care time)  Labs Reviewed  GLUCOSE, CAPILLARY - Abnormal; Notable for the following:    Glucose-Capillary 508 (*)     All other components within normal limits  CBC - Abnormal; Notable for the following:    HCT 38.0 (*)     MCHC 36.1 (*)     All other components within normal limits  COMPREHENSIVE METABOLIC PANEL - Abnormal; Notable for the following:    Sodium 133 (*)     Glucose, Bld 604 (*)     All other components within normal limits  URINALYSIS, ROUTINE W REFLEX MICROSCOPIC - Abnormal; Notable for the following:    Specific  Gravity, Urine >1.046 (*)     Glucose, UA >1000 (*)     All other components within normal limits  BLOOD GAS, VENOUS - Abnormal; Notable for the following:    pH, Ven 7.409 (*)     pCO2, Ven 39.0 (*)     pO2, Ven 61.9 (*)     Bicarbonate 24.2 (*)     All other components within normal limits  GLUCOSE, CAPILLARY - Abnormal; Notable for the following:    Glucose-Capillary 505 (*)     All other components within normal limits  URINE MICROSCOPIC-ADD ON   No results found.   1. Hyperglycemia   2. Toothache       MDM  Patient's not having any symptoms suggestive of DKA. He has history of having poorly controlled blood sugars. IV fluids have been given.  Last blood sugar was in the 200s.  He responded well to IVF and insulin sq. He'll be given a prescription for antibiotics to treat a possible dental infection. I will give him a referral to a dentist        Celene Kras, MD 12/24/11 [phone]

## 2012-03-19 ENCOUNTER — Emergency Department (HOSPITAL_COMMUNITY)
Admission: EM | Admit: 2012-03-19 | Discharge: 2012-03-19 | Disposition: A | Discharge status: Home / Self Care | Payer: Self-pay | Attending: Emergency Medicine | Admitting: Emergency Medicine

## 2012-03-19 ENCOUNTER — Encounter (HOSPITAL_COMMUNITY): Payer: Self-pay | Admitting: Emergency Medicine

## 2012-03-19 DIAGNOSIS — K0889 Other specified disorders of teeth and supporting structures: Secondary | ICD-10-CM

## 2012-03-19 DIAGNOSIS — K089 Disorder of teeth and supporting structures, unspecified: Secondary | ICD-10-CM | POA: Insufficient documentation

## 2012-03-19 DIAGNOSIS — Z87891 Personal history of nicotine dependence: Secondary | ICD-10-CM | POA: Insufficient documentation

## 2012-03-19 DIAGNOSIS — Z794 Long term (current) use of insulin: Secondary | ICD-10-CM | POA: Insufficient documentation

## 2012-03-19 DIAGNOSIS — Z79899 Other long term (current) drug therapy: Secondary | ICD-10-CM | POA: Insufficient documentation

## 2012-03-19 DIAGNOSIS — K029 Dental caries, unspecified: Secondary | ICD-10-CM | POA: Insufficient documentation

## 2012-03-19 DIAGNOSIS — E119 Type 2 diabetes mellitus without complications: Secondary | ICD-10-CM | POA: Insufficient documentation

## 2012-03-19 DIAGNOSIS — J45909 Unspecified asthma, uncomplicated: Secondary | ICD-10-CM | POA: Insufficient documentation

## 2012-03-19 MED ORDER — CLINDAMYCIN HCL 150 MG PO CAPS: ORAL_CAPSULE | ORAL | Status: DC

## 2012-03-19 MED ORDER — HYDROCODONE-ACETAMINOPHEN 5-325 MG PO TABS: ORAL_TABLET | ORAL | Status: DC

## 2012-03-19 NOTE — ED Provider Notes (Signed)
History     CSN: [id number]  Arrival date & time 03/19/12  0711   First MD Initiated Contact with Patient 03/19/12 [phone]      Chief Complaint  Patient presents with  . Dental Pain     HPI Pt was seen at 0740.  Per pt, c/o gradual onset and persistence of constant right lower tooth "pain" for the past few days.  Denies fevers, no intra-oral edema, no rash, no facial swelling, no dysphagia, no neck pain.   The condition is aggravated by nothing. The condition is relieved by nothing. The symptoms have been associated with no other complaints.    Past Medical History  Diagnosis Date  . Asthma   . Diabetes mellitus     History reviewed. No pertinent past surgical history.  Family History  Problem Relation Age of Onset  . Diabetes Other     History  Substance Use Topics  . Smoking status: Former Smoker    Quit date: 10/08/2010  . Smokeless tobacco: Never Used  . Alcohol Use: No    Review of Systems ROS: Statement: All systems negative except as marked or noted in the HPI; Constitutional: Negative for fever and chills. ; ; Eyes: Negative for eye pain and discharge. ; ; ENMT: Positive for dental caries, dental hygiene poor and toothache. Negative for ear pain, bleeding gums, dental injury, facial deformity, facial swelling, hoarseness, nasal congestion, sinus pressure, sore throat, throat swelling and tongue swollen. ; ; Cardiovascular: Negative for chest pain, palpitations, diaphoresis, dyspnea and peripheral edema. ; ; Respiratory: Negative for cough, wheezing and stridor. ; ; Gastrointestinal: Negative for nausea, vomiting, diarrhea and abdominal pain. ; ; Genitourinary: Negative for dysuria, flank pain and hematuria. ; ; Musculoskeletal: Negative for back pain and neck pain. ; ; Skin: Negative for rash and skin lesion. ; ; Neuro: Negative for headache, lightheadedness and neck stiffness. ;    Allergies  Ibuprofen and Penicillins  Home Medications   Current Outpatient Rx    Name  Route  Sig  Dispense  Refill  . ALBUTEROL SULFATE HFA 108 (90 BASE) MCG/ACT IN AERS   Inhalation   Inhale 2 puffs into the lungs every 6 (six) hours as needed. For shortness of breath.         Marland Kitchen FLUTICASONE-SALMETEROL 250-50 MCG/DOSE IN AEPB   Inhalation   Inhale 1 puff into the lungs every 12 (twelve) hours.         . INSULIN GLARGINE 100 UNIT/ML [place] SOLN   Subcutaneous   Inject 8 Units into the skin at bedtime.         . INSULIN REGULAR HUMAN 100 UNIT/ML IJ SOLN   Subcutaneous   Inject 5-10 Units into the skin 3 (three) times daily before meals. Sliding scale         . CLINDAMYCIN HCL 150 MG PO CAPS      3 tabs PO TID x 10 days   90 capsule   0   . HYDROCODONE-ACETAMINOPHEN 5-325 MG PO TABS      1 or 2 tabs PO q6 hours prn pain   20 tablet   0     BP 110/58  Pulse 66  Temp 98.3 F (36.8 C) (Oral)  Resp 16  SpO2 98%  Physical Exam 0745: Physical examination: Vital signs and O2 SAT: Reviewed; Constitutional: Well developed, Well nourished, Well hydrated, In no acute distress; Head and Face: Normocephalic, Atraumatic; Eyes: EOMI, PERRL, No scleral icterus; ENMT: Mouth and pharynx normal,  Poor dentition, Widespread dental decay, Left TM normal, Right TM normal, Mucous membranes moist, +lower right 2nd molar with dental decay.  +gingiva below right lower 1st molar with punctate opening without active drainage, no fluctuance/edema, no erythema.  No gingival erythema, edema, fluctuance, or drainage.  No intra-oral edema. No hoarse voice, no drooling, no stridor.  ; Neck: Supple, Full range of motion, No lymphadenopathy; Cardiovascular: Regular rate and rhythm, No murmur, rub, or gallop; Respiratory: Breath sounds clear & equal bilaterally, No rales, rhonchi, wheezes, or rub, Normal respiratory effort/excursion; Chest: Nontender, Movement normal; Extremities: Pulses normal, No tenderness, No edema; Neuro: AA&Ox3, Major CN grossly intact.  No gross focal motor or  sensory deficits in extremities.; Skin: Color normal, No rash, No petechiae, Warm, Dry   ED Course  Procedures     MDM  MDM Reviewed: nursing note, vitals and previous chart     0800:  Pt encouraged to f/u with dentist or oral surgeon for his dental needs for good continuity of care and definitive treatment.  Verb understanding.         Laray Anger, DO 03/20/12 2153

## 2012-03-19 NOTE — ED Notes (Signed)
Pt woke this morning w/ painful, right lower molar, has noticed drainage as well. Pt has Rx'ed w/ generic Tylenol but has not been to the dentist.

## 2012-03-20 ENCOUNTER — Emergency Department (HOSPITAL_COMMUNITY)
Admission: EM | Admit: 2012-03-20 | Discharge: 2012-03-20 | Disposition: A | Discharge status: Home / Self Care | Payer: Self-pay | Attending: Emergency Medicine | Admitting: Emergency Medicine

## 2012-03-20 ENCOUNTER — Encounter (HOSPITAL_COMMUNITY): Payer: Self-pay | Admitting: *Deleted

## 2012-03-20 DIAGNOSIS — Z79899 Other long term (current) drug therapy: Secondary | ICD-10-CM | POA: Insufficient documentation

## 2012-03-20 DIAGNOSIS — J45909 Unspecified asthma, uncomplicated: Secondary | ICD-10-CM | POA: Insufficient documentation

## 2012-03-20 DIAGNOSIS — E119 Type 2 diabetes mellitus without complications: Secondary | ICD-10-CM | POA: Insufficient documentation

## 2012-03-20 DIAGNOSIS — Z87891 Personal history of nicotine dependence: Secondary | ICD-10-CM | POA: Insufficient documentation

## 2012-03-20 DIAGNOSIS — A64 Unspecified sexually transmitted disease: Secondary | ICD-10-CM | POA: Insufficient documentation

## 2012-03-20 DIAGNOSIS — IMO0002 Reserved for concepts with insufficient information to code with codable children: Secondary | ICD-10-CM | POA: Insufficient documentation

## 2012-03-20 DIAGNOSIS — Z794 Long term (current) use of insulin: Secondary | ICD-10-CM | POA: Insufficient documentation

## 2012-03-20 MED ORDER — LIDOCAINE HCL (PF) 1 % IJ SOLN
INTRAMUSCULAR | Status: AC
  Administered 2012-03-20: 0.9 mL
  Filled 2012-03-20: qty 5

## 2012-03-20 MED ORDER — CEFTRIAXONE SODIUM 250 MG IJ SOLR
250.0000 mg | Freq: Once | INTRAMUSCULAR | Status: AC
  Administered 2012-03-20: 250 mg via INTRAMUSCULAR
  Filled 2012-03-20: qty 250

## 2012-03-20 MED ORDER — AZITHROMYCIN 250 MG PO TABS
1000.0000 mg | ORAL_TABLET | Freq: Once | ORAL | Status: AC
  Administered 2012-03-20: 1000 mg via ORAL
  Filled 2012-03-20: qty 4

## 2012-03-20 NOTE — ED Notes (Signed)
Reports having penile discharge and burning pain with urination.

## 2012-03-20 NOTE — ED Provider Notes (Signed)
History     CSN: [id number]  Arrival date & time 03/20/12  1750   First MD Initiated Contact with Patient 03/20/12 2015      Chief Complaint  Patient presents with  . SEXUALLY TRANSMITTED DISEASE    (Consider location/radiation/quality/duration/timing/severity/associated sxs/prior treatment) HPI  Pt presents to the ED with complaints of penile discharge for 3 days and burning that started today. He admits to having unprotected sex. He thinks he knows which partner he is getting it from. He denies abdominal pain, fevers, N/V/D. nad vss  Past Medical History  Diagnosis Date  . Asthma   . Diabetes mellitus     History reviewed. No pertinent past surgical history.  Family History  Problem Relation Age of Onset  . Diabetes Other     History  Substance Use Topics  . Smoking status: Former Smoker    Quit date: 10/08/2010  . Smokeless tobacco: Never Used  . Alcohol Use: No      Review of Systems  All other systems reviewed and are negative.    Allergies  Ibuprofen and Penicillins  Home Medications   Current Outpatient Rx  Name  Route  Sig  Dispense  Refill  . ALBUTEROL SULFATE HFA 108 (90 BASE) MCG/ACT IN AERS   Inhalation   Inhale 2 puffs into the lungs every 6 (six) hours as needed. For shortness of breath.         Marland Kitchen FLUTICASONE-SALMETEROL 250-50 MCG/DOSE IN AEPB   Inhalation   Inhale 1 puff into the lungs every 12 (twelve) hours.         . INSULIN GLARGINE 100 UNIT/ML [place] SOLN   Subcutaneous   Inject 8 Units into the skin at bedtime.         . INSULIN REGULAR HUMAN 100 UNIT/ML IJ SOLN   Subcutaneous   Inject 5-10 Units into the skin 3 (three) times daily before meals. Sliding scale           BP 108/57  Pulse 72  Temp 97.2 F (36.2 C) (Oral)  Resp 16  SpO2 99%  Physical Exam  Nursing note and vitals reviewed. Constitutional: He appears well-developed and well-nourished. No distress.  HENT:  Head: Normocephalic and atraumatic.  Eyes:  Pupils are equal, round, and reactive to light.  Neck: Normal range of motion. Neck supple.  Cardiovascular: Normal rate and regular rhythm.   Pulmonary/Chest: Effort normal.  Abdominal: Soft.  Genitourinary: Discharge found.  Neurological: He is alert.  Skin: Skin is warm and dry.    ED Course  Procedures (including critical care time)   Labs Reviewed  GC/CHLAMYDIA PROBE AMP   No results found.   No diagnosis found. DX: STD   MDM  Cultures taken. Rocephin and Azithro given in ED.  Sex education given.  Pt has been advised of the symptoms that warrant their return to the ED. Patient has voiced understanding and has agreed to follow-up with the PCP or specialist.         Dorthula Matas, PA 03/20/12 2048

## 2012-03-22 LAB — GC/CHLAMYDIA PROBE AMP
CT Probe RNA: POSITIVE — AB
GC Probe RNA: NEGATIVE

## 2012-03-23 ENCOUNTER — Telehealth (HOSPITAL_COMMUNITY): Payer: Self-pay | Admitting: Emergency Medicine

## 2012-03-23 NOTE — ED Notes (Signed)
Pt called for lab results.  ID verified x 2.  Pt informed of (+) Chlamydia & txd appropriately w/Zithromax 1,000 mg po and Rocephin 250 mg IM while in ED 03/20/12.  Pt informed of need to notify partner(s) for testing & tx and abstain from sex x 2 wks post tx. DHHS form completed and faxed to [facility].

## 2012-03-23 NOTE — ED Provider Notes (Signed)
Medical screening examination/treatment/procedure(s) were performed by non-physician practitioner and as supervising physician I was immediately available for consultation/collaboration.  Raeford Razor, MD 03/23/12 [phone]

## 2012-04-09 ENCOUNTER — Emergency Department (HOSPITAL_COMMUNITY)
Admission: EM | Admit: 2012-04-09 | Discharge: 2012-04-09 | Disposition: A | Discharge status: Home / Self Care | Payer: Self-pay | Attending: Emergency Medicine | Admitting: Emergency Medicine

## 2012-04-09 ENCOUNTER — Encounter (HOSPITAL_COMMUNITY): Payer: Self-pay | Admitting: Emergency Medicine

## 2012-04-09 DIAGNOSIS — R369 Urethral discharge, unspecified: Secondary | ICD-10-CM

## 2012-04-09 DIAGNOSIS — J45909 Unspecified asthma, uncomplicated: Secondary | ICD-10-CM | POA: Insufficient documentation

## 2012-04-09 DIAGNOSIS — E119 Type 2 diabetes mellitus without complications: Secondary | ICD-10-CM | POA: Insufficient documentation

## 2012-04-09 DIAGNOSIS — A630 Anogenital (venereal) warts: Secondary | ICD-10-CM | POA: Insufficient documentation

## 2012-04-09 DIAGNOSIS — Z87891 Personal history of nicotine dependence: Secondary | ICD-10-CM | POA: Insufficient documentation

## 2012-04-09 DIAGNOSIS — IMO0002 Reserved for concepts with insufficient information to code with codable children: Secondary | ICD-10-CM | POA: Insufficient documentation

## 2012-04-09 DIAGNOSIS — Z794 Long term (current) use of insulin: Secondary | ICD-10-CM | POA: Insufficient documentation

## 2012-04-09 DIAGNOSIS — R21 Rash and other nonspecific skin eruption: Secondary | ICD-10-CM | POA: Insufficient documentation

## 2012-04-09 DIAGNOSIS — Z79899 Other long term (current) drug therapy: Secondary | ICD-10-CM | POA: Insufficient documentation

## 2012-04-09 DIAGNOSIS — Z8619 Personal history of other infectious and parasitic diseases: Secondary | ICD-10-CM | POA: Insufficient documentation

## 2012-04-09 MED ORDER — CEFTRIAXONE SODIUM 250 MG IJ SOLR
250.0000 mg | Freq: Once | INTRAMUSCULAR | Status: AC
  Administered 2012-04-09: 250 mg via INTRAMUSCULAR
  Filled 2012-04-09: qty 250

## 2012-04-09 MED ORDER — AZITHROMYCIN 250 MG PO TABS
1000.0000 mg | ORAL_TABLET | Freq: Once | ORAL | Status: AC
  Administered 2012-04-09: 1000 mg via ORAL
  Filled 2012-04-09: qty 4

## 2012-04-09 NOTE — ED Notes (Signed)
Pt complains of penile discharge x 1 day

## 2012-04-09 NOTE — ED Provider Notes (Signed)
History     CSN: [id number]  Arrival date & time 04/09/12  1113   First MD Initiated Contact with Patient 04/09/12 1114      No chief complaint on file.   (Consider location/radiation/quality/duration/timing/severity/associated sxs/prior treatment) HPI  22 year old male with history of asthma, and diabetes presents for evaluations of penile discharge. Patient report since yesterday he has notice penile discharge and odor.  This concerns him as he was diagnosed with chlamydia 2 weeks ago when he was seen in ER for penile discharge.  Sts he is sexually active with same partner, who has been seen and treated for her infection as well. Does use protection on occasion. He denies fever, chills, abd pain, dysuria, burning on urination, testicle pain or scrotal swelling.  He has had a rash to his foreskin for nearly a year, occasionally itch.  Has been seen for this rash in the past and was given "some cream" which has not help.    Past Medical History  Diagnosis Date  . Asthma   . Diabetes mellitus     No past surgical history on file.  Family History  Problem Relation Age of Onset  . Diabetes Other     History  Substance Use Topics  . Smoking status: Former Smoker    Quit date: 10/08/2010  . Smokeless tobacco: Never Used  . Alcohol Use: No      Review of Systems  Constitutional: Negative for fever.  Gastrointestinal: Negative for abdominal pain.  Genitourinary: Positive for discharge. Negative for dysuria, hematuria, flank pain, decreased urine volume, scrotal swelling, penile pain and testicular pain.  Musculoskeletal: Negative for back pain.  Skin: Positive for rash.    Allergies  Ibuprofen and Penicillins  Home Medications   Current Outpatient Rx  Name  Route  Sig  Dispense  Refill  . ALBUTEROL SULFATE HFA 108 (90 BASE) MCG/ACT IN AERS   Inhalation   Inhale 2 puffs into the lungs every 6 (six) hours as needed. For shortness of breath.         Marland Kitchen  FLUTICASONE-SALMETEROL 250-50 MCG/DOSE IN AEPB   Inhalation   Inhale 1 puff into the lungs every 12 (twelve) hours.         . INSULIN GLARGINE 100 UNIT/ML [place] SOLN   Subcutaneous   Inject 8 Units into the skin at bedtime.         . INSULIN REGULAR HUMAN 100 UNIT/ML IJ SOLN   Subcutaneous   Inject 5-10 Units into the skin 3 (three) times daily before meals. Sliding scale           There were no vitals taken for this visit.  Physical Exam  Nursing note and vitals reviewed. Constitutional: He appears well-developed and well-nourished. No distress.  HENT:  Head: Atraumatic.  Eyes: Conjunctivae normal are normal.  Neck: Neck supple.  Abdominal: There is no tenderness. Hernia confirmed negative in the right inguinal area and confirmed negative in the left inguinal area.       No CVA tenderness  Genitourinary: Testes normal.    Right testis shows no swelling and no tenderness. Left testis shows no swelling and no tenderness. Uncircumcised. No penile tenderness. No discharge found.  Lymphadenopathy:       Right: No inguinal adenopathy present.       Left: No inguinal adenopathy present.    ED Course  Procedures (including critical care time)  Labs Reviewed - No data to display No results found.   No diagnosis found.  11:39 AM Pt with c/o penile discharge and prior hx of STD.  GC/C culture obtained.  Rocephin/zithromax given here in ER.  Pt also has a rash/lesions to his foreskin, ongoing for months.  Was thought to be herpetic lesion and was given acyclovir in July of last year, with no improvement.  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    Discussed with my attending who seen and evaluate pt and recommend f/u with urologist for further treatment of HPV. We also [facility] to get tested for HIV, Syphilis as well.  Pt voice understanding and agrees with plan.  Pt instruct to avoid sexual activity until sxs cleared.    BP 108/67  Pulse 79  Temp 97.8 F (36.6  C) (Oral)  Resp 16  SpO2 97%  1. Penile discharge 2. HPV  MDM        Fayrene Helper, PA-C 04/09/12 1152

## 2012-04-09 NOTE — ED Notes (Signed)
MD at bedside. 

## 2012-04-10 NOTE — ED Provider Notes (Signed)
Medical screening examination/treatment/procedure(s) were performed by non-physician practitioner and as supervising physician I was immediately available for consultation/collaboration.  Nathanial Arrighi T Raheim Beutler, MD 04/10/12 1455 

## 2012-04-12 NOTE — ED Notes (Addendum)
+   Chlamydia Patient treated with Rocephin and Zithromax-DHHS letter faxed 

## 2012-04-14 ENCOUNTER — Telehealth (HOSPITAL_COMMUNITY): Payer: Self-pay | Admitting: *Deleted

## 2014-06-19 ENCOUNTER — Encounter (HOSPITAL_COMMUNITY): Payer: Self-pay

## 2014-06-19 ENCOUNTER — Emergency Department (HOSPITAL_COMMUNITY)
Admission: EM | Admit: 2014-06-19 | Discharge: 2014-06-19 | Disposition: A | Discharge status: Home / Self Care | Payer: Self-pay | Attending: Emergency Medicine | Admitting: Emergency Medicine

## 2014-06-19 DIAGNOSIS — Z880 Allergy status to penicillin: Secondary | ICD-10-CM | POA: Insufficient documentation

## 2014-06-19 DIAGNOSIS — J45909 Unspecified asthma, uncomplicated: Secondary | ICD-10-CM | POA: Insufficient documentation

## 2014-06-19 DIAGNOSIS — Z794 Long term (current) use of insulin: Secondary | ICD-10-CM | POA: Insufficient documentation

## 2014-06-19 DIAGNOSIS — Z87891 Personal history of nicotine dependence: Secondary | ICD-10-CM | POA: Insufficient documentation

## 2014-06-19 DIAGNOSIS — E1165 Type 2 diabetes mellitus with hyperglycemia: Secondary | ICD-10-CM | POA: Insufficient documentation

## 2014-06-19 DIAGNOSIS — R739 Hyperglycemia, unspecified: Secondary | ICD-10-CM

## 2014-06-19 DIAGNOSIS — Z79899 Other long term (current) drug therapy: Secondary | ICD-10-CM | POA: Insufficient documentation

## 2014-06-19 LAB — CBC
HCT: 48.9 % (ref 39.0–52.0)
Hemoglobin: 18.8 g/dL — ABNORMAL HIGH (ref 13.0–17.0)
MCH: 29.1 pg (ref 26.0–34.0)
MCHC: 37.4 g/dL — ABNORMAL HIGH (ref 30.0–36.0)
MCV: 75.7 fL — AB (ref 78.0–100.0)
PLATELETS: 225 10*3/uL (ref 150–400)
RBC: 6.46 MIL/uL — ABNORMAL HIGH (ref 4.22–5.81)
RDW: 12.3 % (ref 11.5–15.5)
WBC: 5.4 10*3/uL (ref 4.0–10.5)

## 2014-06-19 LAB — URINALYSIS, ROUTINE W REFLEX MICROSCOPIC
Bilirubin Urine: NEGATIVE
Glucose, UA: >1000 mg/dL — AB
Hgb urine dipstick: NEGATIVE
Ketones, ur: 40 mg/dL — AB
LEUKOCYTES UA: NEGATIVE
NITRITE: NEGATIVE
PH: 6 (ref 5.0–8.0)
Protein, ur: NEGATIVE mg/dL
SPECIFIC GRAVITY, URINE: 1.04 — AB (ref 1.005–1.030)
Urobilinogen, UA: 1 mg/dL (ref 0.0–1.0)

## 2014-06-19 LAB — COMPREHENSIVE METABOLIC PANEL
ALBUMIN: 4.4 g/dL (ref 3.5–5.2)
ALT: 30 U/L (ref 0–53)
ANION GAP: 13 (ref 5–15)
AST: 17 U/L (ref 0–37)
Alkaline Phosphatase: 84 U/L (ref 39–117)
BUN: 10 mg/dL (ref 6–23)
CALCIUM: 9.9 mg/dL (ref 8.4–10.5)
CO2: 23 mmol/L (ref 19–32)
CREATININE: 1 mg/dL (ref 0.50–1.35)
Chloride: 99 mmol/L (ref 96–112)
GFR calc Af Amer: >90 mL/min (ref >90)
GFR calc non Af Amer: >90 mL/min (ref >90)
Glucose, Bld: 310 mg/dL — ABNORMAL HIGH (ref 70–99)
Potassium: 4.4 mmol/L (ref 3.5–5.1)
SODIUM: 135 mmol/L (ref 135–145)
TOTAL PROTEIN: 7.5 g/dL (ref 6.0–8.3)
Total Bilirubin: 1 mg/dL (ref 0.3–1.2)

## 2014-06-19 LAB — URINE MICROSCOPIC-ADD ON

## 2014-06-19 LAB — CBG MONITORING, ED
GLUCOSE-CAPILLARY: 166 mg/dL — AB (ref 70–99)
Glucose-Capillary: 275 mg/dL — ABNORMAL HIGH (ref 70–99)
Glucose-Capillary: 298 mg/dL — ABNORMAL HIGH (ref 70–99)

## 2014-06-19 MED ORDER — SODIUM CHLORIDE 0.9 % IV BOLUS (SEPSIS)
1000.0000 mL | Freq: Once | INTRAVENOUS | Status: AC
  Administered 2014-06-19: 1000 mL via INTRAVENOUS

## 2014-06-19 MED ORDER — INSULIN REGULAR HUMAN 100 UNIT/ML IJ SOLN: 5.0000 [IU] | Freq: Three times a day (TID) | INTRAMUSCULAR | Status: AC

## 2014-06-19 MED ORDER — INSULIN ASPART 100 UNIT/ML ~~LOC~~ SOLN
10.0000 [IU] | Freq: Once | SUBCUTANEOUS | Status: AC
Start: 2014-06-19 — End: 2014-06-19
  Administered 2014-06-19: 10 [IU] via SUBCUTANEOUS
  Filled 2014-06-19: qty 1

## 2014-06-19 NOTE — ED Notes (Signed)
Pt is diabetic and ran out of medicine over the weekend. Has been feeling tired and blurred vision. thristy and urinating a lot. Denies any pain. Has some nausea and no vomiting.

## 2014-06-19 NOTE — ED Notes (Signed)
CBG was 275

## 2014-06-19 NOTE — Discharge Instructions (Signed)
As discussed, it is important that you follow up as soon as possible with your physician for continued management of your condition. ° °If you develop any new, or concerning changes in your condition, please return to the emergency department immediately. ° °

## 2014-06-19 NOTE — ED Notes (Signed)
CBG 166 

## 2014-06-19 NOTE — ED Notes (Signed)
CBG 298 

## 2014-06-19 NOTE — ED Provider Notes (Addendum)
CSN: [id number]     Arrival date & time 06/19/14  1458 History   First MD Initiated Contact with Patient 06/19/14 1630     Chief Complaint  Patient presents with  . Hyperglycemia     (Consider location/radiation/quality/duration/timing/severity/associated sxs/prior Treatment) HPI He presents with concern of fatigue, blurred vision, thirst, polyuria, polydipsia. Patient is a history of insulin dependent diabetes. Patient last took insulin 4 days ago.  Patient ran out of his medication. Symptoms of developed and progressed since that time. She denies new syncope, vomiting, diarrhea, abdominal pain, chest pain. Symptoms are not improved with oral hydration.  Past Medical History  Diagnosis Date  . Asthma   . Diabetes mellitus    History reviewed. No pertinent past surgical history. Family History  Problem Relation Age of Onset  . Diabetes Other    History  Substance Use Topics  . Smoking status: Former Smoker    Quit date: 10/08/2010  . Smokeless tobacco: Never Used  . Alcohol Use: No    Review of Systems  Constitutional:       Per HPI, otherwise negative  HENT:       Per HPI, otherwise negative  Respiratory:       Per HPI, otherwise negative  Cardiovascular:       Per HPI, otherwise negative  Gastrointestinal: Negative for vomiting.  Endocrine:       Negative aside from HPI  Genitourinary:       Neg aside from HPI   Musculoskeletal:       Per HPI, otherwise negative  Skin: Negative.   Neurological: Negative for syncope.      Allergies  Ibuprofen and Penicillins  Home Medications   Prior to Admission medications   Medication Sig Start Date End Date Taking? Authorizing Provider  albuterol (PROVENTIL HFA;VENTOLIN HFA) 108 (90 BASE) MCG/ACT inhaler Inhale 2 puffs into the lungs every 6 (six) hours as needed. For shortness of breath.    Historical Provider, MD  Fluticasone-Salmeterol (ADVAIR) 250-50 MCG/DOSE AEPB Inhale 1 puff into the lungs every 12  (twelve) hours.    Historical Provider, MD  insulin glargine (LANTUS) 100 UNIT/ML injection Inject 8 Units into the skin at bedtime.    Historical Provider, MD  insulin regular (NOVOLIN R,HUMULIN R) 100 units/mL injection Inject 5-10 Units into the skin 3 (three) times daily before meals. Sliding scale    Historical Provider, MD   BP 114/55 mmHg  Pulse 64  Temp(Src) 97.9 F (36.6 C) (Oral)  Resp 16  Ht 5\' 10"  (1.778 m)  Wt 125 lb (56.7 kg)  BMI 17.94 kg/m2  SpO2 98% Physical Exam  Constitutional: He is oriented to person, place, and time. He appears well-developed. No distress.  HENT:  Head: Normocephalic and atraumatic.  Eyes: Conjunctivae and EOM are normal. Right eye exhibits no discharge. Left eye exhibits no discharge.  Cardiovascular: Normal rate and regular rhythm.   Pulmonary/Chest: Effort normal. No stridor. No respiratory distress.  Abdominal: He exhibits no distension. There is no tenderness.  Musculoskeletal: He exhibits no edema.  Neurological: He is alert and oriented to person, place, and time.  Skin: Skin is warm and dry.  Psychiatric: He has a normal mood and affect.  Nursing note and vitals reviewed.   ED Course  Procedures (including critical care time) Labs Review Labs Reviewed  CBC - Abnormal; Notable for the following:    RBC 6.46 (*)    Hemoglobin 18.8 (*)    MCV 75.7 (*)  MCHC 37.4 (*)    All other components within normal limits  COMPREHENSIVE METABOLIC PANEL - Abnormal; Notable for the following:    Glucose, Bld 310 (*)    All other components within normal limits  URINALYSIS, ROUTINE W REFLEX MICROSCOPIC - Abnormal; Notable for the following:    Specific Gravity, Urine 1.040 (*)    Glucose, UA >1000 (*)    Ketones, ur 40 (*)    All other components within normal limits  URINE MICROSCOPIC-ADD ON  CBG MONITORING, ED    Patient has hyperglycemia and ketonuria. IV fluids provided.  Update: On repeat exam the patient is in no  distress.  8:13 PM BG 166 - no active complaints MDM   Patient presents with concern of fatigue, hyperglycemia. Here the patient is hyperglycemic, with ketonuria, though with no anion gap, no evidence for acidosis, or imminent decompensation. Patient is hemodynamically stable. No evidence for concurrent infection. Patient had blurred vision, but no objective visual deficit. Patient discharged in stable condition, encouraged to proceed with insulin use to control his diabetes.  Gerhard Munch, MD 06/19/14 1712  Gerhard Munch, MD 06/19/14 2013

## 2014-06-26 ENCOUNTER — Inpatient Hospital Stay: Payer: Self-pay | Admitting: Family Medicine

## 2014-08-22 ENCOUNTER — Emergency Department (HOSPITAL_COMMUNITY)
Admission: EM | Admit: 2014-08-22 | Discharge: 2014-08-22 | Disposition: A | Discharge status: Home / Self Care | Payer: Self-pay | Attending: Emergency Medicine | Admitting: Emergency Medicine

## 2014-08-22 ENCOUNTER — Encounter (HOSPITAL_COMMUNITY): Payer: Self-pay

## 2014-08-22 DIAGNOSIS — Z79899 Other long term (current) drug therapy: Secondary | ICD-10-CM | POA: Insufficient documentation

## 2014-08-22 DIAGNOSIS — Z794 Long term (current) use of insulin: Secondary | ICD-10-CM | POA: Insufficient documentation

## 2014-08-22 DIAGNOSIS — R5383 Other fatigue: Secondary | ICD-10-CM | POA: Insufficient documentation

## 2014-08-22 DIAGNOSIS — J45909 Unspecified asthma, uncomplicated: Secondary | ICD-10-CM | POA: Insufficient documentation

## 2014-08-22 DIAGNOSIS — Z880 Allergy status to penicillin: Secondary | ICD-10-CM | POA: Insufficient documentation

## 2014-08-22 DIAGNOSIS — J039 Acute tonsillitis, unspecified: Secondary | ICD-10-CM | POA: Insufficient documentation

## 2014-08-22 DIAGNOSIS — E119 Type 2 diabetes mellitus without complications: Secondary | ICD-10-CM | POA: Insufficient documentation

## 2014-08-22 DIAGNOSIS — R110 Nausea: Secondary | ICD-10-CM | POA: Insufficient documentation

## 2014-08-22 LAB — RAPID STREP SCREEN (MED CTR MEBANE ONLY): Streptococcus, Group A Screen (Direct): NEGATIVE

## 2014-08-22 MED ORDER — HYDROCODONE-ACETAMINOPHEN 7.5-325 MG/15ML PO SOLN: 10.0000 mL | Freq: Four times a day (QID) | ORAL | Status: AC | PRN

## 2014-08-22 NOTE — Discharge Instructions (Signed)

## 2014-08-22 NOTE — ED Provider Notes (Signed)
CSN: [id number]     Arrival date & time 08/22/14  1227 History   This chart was scribed for Marlon Pel, PA-C working with Eber Hong, MD by Elveria Rising, ED Scribe. This patient was seen in room WTR5/WTR5 and the patient's care was started at 1:54 PM.   Chief Complaint  Patient presents with  . Sore Throat   The history is provided by the patient. No language interpreter was used.   HPI Comments: Blood pressure 113/78, pulse 77, temperature 97.7 F (36.5 C), temperature source Oral, resp. rate 19, SpO2 100 %. Angel Barrett is a 24 y.o. male who presents to the Emergency Department complaining of URI like symptoms including right sore throat, onset last night The pain is  bilateral. Associated symptoms including swollen tonsils, nausea, malaise, fatigue, and pain with swallowing.    The patient denies diaphoresis, fever, headache, weakness (general or focal), confusion, change of vision,  neck pain, dysphagia, aphagia, chest pain, shortness of breath,  back pain, abdominal pains, nausea, vomiting, diarrhea, lower extremity swelling, rash.   Past Medical History  Diagnosis Date  . Asthma   . Diabetes mellitus    History reviewed. No pertinent past surgical history. Family History  Problem Relation Age of Onset  . Diabetes Other    History  Substance Use Topics  . Smoking status: Former Smoker    Quit date: 10/08/2010  . Smokeless tobacco: Never Used  . Alcohol Use: No    Review of Systems  Constitutional: Positive for fatigue. Negative for fever.  HENT: Positive for sore throat. Negative for congestion.   Respiratory: Negative for cough.   Gastrointestinal: Positive for nausea. Negative for vomiting.    Allergies  Other; Ibuprofen; and Penicillins  Home Medications   Prior to Admission medications   Medication Sig Start Date End Date Taking? Authorizing Provider  albuterol (PROVENTIL HFA;VENTOLIN HFA) 108 (90 BASE) MCG/ACT inhaler Inhale 2 puffs into the lungs  every 6 (six) hours as needed. For shortness of breath.   Yes Historical Provider, MD  insulin glargine (LANTUS) 100 UNIT/ML injection Inject 8 Units into the skin at bedtime.   Yes Historical Provider, MD  insulin regular (NOVOLIN R,HUMULIN R) 100 units/mL injection Inject 0.05-0.1 mLs (5-10 Units total) into the skin 3 (three) times daily before meals. Sliding scale 06/19/14  Yes Gerhard Munch, MD   Triage Vitals: BP 113/78 mmHg  Pulse 77  Temp(Src) 97.7 F (36.5 C) (Oral)  Resp 19  SpO2 100% Physical Exam  Constitutional: He is oriented to person, place, and time. He appears well-developed and well-nourished. No distress.  HENT:  Head: Normocephalic and atraumatic.  Right Ear: Tympanic membrane, external ear and ear canal normal.  Left Ear: Tympanic membrane, external ear and ear canal normal.  Nose: Nose normal. No rhinorrhea. Right sinus exhibits no maxillary sinus tenderness and no frontal sinus tenderness. Left sinus exhibits no maxillary sinus tenderness and no frontal sinus tenderness.  Mouth/Throat: Uvula is midline and mucous membranes are normal. No trismus in the jaw. Normal dentition. No dental abscesses or uvula swelling. Posterior oropharyngeal edema present. No oropharyngeal exudate, posterior oropharyngeal erythema or tonsillar abscesses.  No submental edema, tongue not elevated, no trismus. No impending airway obstruction; Pt able to speak full sentences, swallow intact, no drooling, stridor, or tonsillar/uvula displacement. No palatal petechia  Eyes: Conjunctivae and EOM are normal.  Neck: Trachea normal, normal range of motion and full passive range of motion without pain. Neck supple. No rigidity. No tracheal deviation and  normal range of motion present. No Brudzinski's sign noted.  Flexion and extension of neck without pain or difficulty. Able to breath without difficulty in extension.  Cardiovascular: Normal rate and regular rhythm.   Pulmonary/Chest: Effort normal and  breath sounds normal. No stridor. No respiratory distress. He has no wheezes.  Abdominal: Soft. There is no tenderness.  No obvious evidence of splenomegaly. Non ttp.   Musculoskeletal: Normal range of motion.  Lymphadenopathy:       Head (right side): No preauricular and no posterior auricular adenopathy present.       Head (left side): No preauricular and no posterior auricular adenopathy present.    He has no cervical adenopathy.  Neurological: He is alert and oriented to person, place, and time.  Skin: Skin is warm and dry. No rash noted. He is not diaphoretic.  Psychiatric: He has a normal mood and affect. His behavior is normal.  Nursing note and vitals reviewed.   ED Course  Procedures (including critical care time)  COORDINATION OF CARE: 1:58 PM- Plans for symptomatic treatment, given negative strep; patient advised to rest. Discussed treatment plan with patient at bedside and patient agreed to plan.   Labs Review Labs Reviewed  RAPID STREP SCREEN (NOT AT [facility])  CULTURE, GROUP A STREP    Imaging Review No results found.   EKG Interpretation None      MDM   Final diagnoses:  Tonsillitis    Strep screen is negative, there are no pustules or significant amount of swelling to the tonsils. And about is not warranted at this time. Patient given return precautions as well as a work note. Treatment is symptomatic.  Medications - No data to display  24 y.o.Angel Barrett's evaluation in the Emergency Department is complete. It has been determined that no acute conditions requiring further emergency intervention are present at this time. The patient/guardian have been advised of the diagnosis and plan. We have discussed signs and symptoms that warrant return to the ED, such as changes or worsening in symptoms.  Vital signs are stable at discharge. Filed Vitals:   08/22/14 1236  BP: 113/78  Pulse: 77  Temp: 97.7 F (36.5 C)  Resp: 19    Patient/guardian has voiced  understanding and agreed to follow-up with the PCP or specialist.  I personally performed the services described in this documentation, which was scribed in my presence. The recorded information has been reviewed and is accurate.    Marlon Pel, PA-C 08/22/14 1402  Eber Hong, MD 08/22/14 [phone]

## 2014-08-22 NOTE — ED Notes (Signed)
Pt presents with c/o sore throat that started last night. Pt reports he has pain with swallowing. Also reports his tongue and gums have been sore.

## 2014-08-24 LAB — CULTURE, GROUP A STREP: Strep A Culture: NEGATIVE
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
## 2014-10-05 ENCOUNTER — Encounter (HOSPITAL_COMMUNITY): Payer: Self-pay | Admitting: Emergency Medicine

## 2014-10-05 ENCOUNTER — Emergency Department (HOSPITAL_COMMUNITY)
Admission: EM | Admit: 2014-10-05 | Discharge: 2014-10-05 | Disposition: A | Discharge status: Home / Self Care | Payer: Self-pay | Attending: Emergency Medicine | Admitting: Emergency Medicine

## 2014-10-05 DIAGNOSIS — Z79899 Other long term (current) drug therapy: Secondary | ICD-10-CM | POA: Insufficient documentation

## 2014-10-05 DIAGNOSIS — E119 Type 2 diabetes mellitus without complications: Secondary | ICD-10-CM | POA: Insufficient documentation

## 2014-10-05 DIAGNOSIS — Z880 Allergy status to penicillin: Secondary | ICD-10-CM | POA: Insufficient documentation

## 2014-10-05 DIAGNOSIS — J45909 Unspecified asthma, uncomplicated: Secondary | ICD-10-CM | POA: Insufficient documentation

## 2014-10-05 DIAGNOSIS — Z87891 Personal history of nicotine dependence: Secondary | ICD-10-CM | POA: Insufficient documentation

## 2014-10-05 DIAGNOSIS — Z794 Long term (current) use of insulin: Secondary | ICD-10-CM | POA: Insufficient documentation

## 2014-10-05 DIAGNOSIS — B3742 Candidal balanitis: Secondary | ICD-10-CM | POA: Insufficient documentation

## 2014-10-05 LAB — URINALYSIS, ROUTINE W REFLEX MICROSCOPIC
Bilirubin Urine: NEGATIVE
Hgb urine dipstick: NEGATIVE
KETONES UR: 15 mg/dL — AB
Leukocytes, UA: NEGATIVE
NITRITE: NEGATIVE
PH: 6 (ref 5.0–8.0)
Protein, ur: NEGATIVE mg/dL
SPECIFIC GRAVITY, URINE: 1.035 — AB (ref 1.005–1.030)
Urobilinogen, UA: 1 mg/dL (ref 0.0–1.0)

## 2014-10-05 LAB — URINE MICROSCOPIC-ADD ON

## 2014-10-05 LAB — HIV ANTIBODY (ROUTINE TESTING W REFLEX): HIV Screen 4th Generation wRfx: NONREACTIVE

## 2014-10-05 LAB — CBG MONITORING, ED: GLUCOSE-CAPILLARY: 199 mg/dL — AB (ref 65–99)

## 2014-10-05 LAB — RPR: RPR Ser Ql: NONREACTIVE

## 2014-10-05 MED ORDER — AZITHROMYCIN 1 G PO PACK
2.0000 g | PACK | Freq: Once | ORAL | Status: AC
  Administered 2014-10-05: 2 g via ORAL
  Filled 2014-10-05: qty 2

## 2014-10-05 MED ORDER — MICONAZOLE NITRATE 2 % EX CREA: 1.0000 "application " | TOPICAL_CREAM | Freq: Two times a day (BID) | CUTANEOUS | Status: AC

## 2014-10-05 NOTE — ED Provider Notes (Signed)
CSN: [id number]     Arrival date & time 10/05/14  0902 History   First MD Initiated Contact with Patient 10/05/14 [phone]     Chief Complaint  Patient presents with  . SEXUALLY TRANSMITTED DISEASE     (Consider location/radiation/quality/duration/timing/severity/associated sxs/prior Treatment) HPI Patient is 24 year old male past medical history of diabetes who presents the ER complaining of penile discharge. Patient reports a yellowish discharge from his urethral meatus for the past week along with irritation of his foreskin and glans of his penis. Patient reports being sexually active with 2 partners in the past month, states he has used protection. Patient denies any abdominal pain, nausea, vomiting, dysuria, testicular pain or swelling. Patient denies fever  Past Medical History  Diagnosis Date  . Asthma   . Diabetes mellitus    History reviewed. No pertinent past surgical history. Family History  Problem Relation Age of Onset  . Diabetes Other    History  Substance Use Topics  . Smoking status: Former Smoker    Quit date: 10/08/2010  . Smokeless tobacco: Never Used  . Alcohol Use: Yes     Comment: socially    Review of Systems  Constitutional: Negative for fever.  Eyes: Negative for visual disturbance.  Respiratory: Negative for shortness of breath.   Cardiovascular: Negative for chest pain.  Gastrointestinal: Negative for nausea, vomiting and abdominal pain.  Genitourinary: Positive for discharge. Negative for dysuria.  Skin: Negative for rash.  Neurological: Negative for dizziness, syncope, weakness and numbness.  Psychiatric/Behavioral: Negative.       Allergies  Other; Ibuprofen; and Penicillins  Home Medications   Prior to Admission medications   Medication Sig Start Date End Date Taking? Authorizing Provider  insulin glargine (LANTUS) 100 UNIT/ML injection Inject 20 Units into the skin at bedtime.    Yes Historical Provider, MD  insulin regular (NOVOLIN  R,HUMULIN R) 100 units/mL injection Inject 0.05-0.1 mLs (5-10 Units total) into the skin 3 (three) times daily before meals. Sliding scale Patient taking differently: Inject 5-20 Units into the skin 3 (three) times daily before meals. Sliding scale 06/19/14  Yes Gerhard Munch, MD  vitamin C (ASCORBIC ACID) 500 MG tablet Take 500 mg by mouth daily.   Yes Historical Provider, MD  albuterol (PROVENTIL HFA;VENTOLIN HFA) 108 (90 BASE) MCG/ACT inhaler Inhale 2 puffs into the lungs every 6 (six) hours as needed. For shortness of breath.    Historical Provider, MD  HYDROcodone-acetaminophen (HYCET) 7.5-325 mg/15 ml solution Take 10 mLs by mouth 4 (four) times daily as needed for moderate pain. Patient not taking: Reported on 10/05/2014 08/22/14   Marlon Pel, PA-C  miconazole (MICOTIN) 2 % cream Apply 1 application topically 2 (two) times daily. 10/05/14   Ladona Mow, PA-C   BP 102/51 mmHg  Pulse 80  Temp(Src) 97.9 F (36.6 C) (Oral)  Resp 16  Ht  (1.778 m)  Wt 127 lb (57.607 kg)  BMI 18.22 kg/m2  SpO2 100% Physical Exam  Constitutional: He is oriented to person, place, and time. He appears well-developed and well-nourished. No distress.  HENT:  Head: Normocephalic and atraumatic.  Eyes: Right eye exhibits no discharge. Left eye exhibits no discharge. No scleral icterus.  Neck: Normal range of motion.  Pulmonary/Chest: Effort normal. No respiratory distress.  Abdominal: There is no tenderness. There is no rigidity, no guarding, no tenderness at McBurney's point and negative Murphy's sign.  Genitourinary: Testes normal. Cremasteric reflex is present. Right testis shows no mass, no swelling and no tenderness. Right  testis is descended. Cremasteric reflex is not absent on the right side. Left testis shows no mass, no swelling and no tenderness. Left testis is descended. Cremasteric reflex is not absent on the left side. Uncircumcised. Penile tenderness present. No phimosis or paraphimosis.  Discharge found.  Mild erythema and dry, cracked skin at the tip of the foreskin, mild erythema noted to the glans of penis. Mild, clear colored discharge from urethral meatus. No testicular pain, swelling or erythema. Chaperone present during entire GU exam.  Musculoskeletal: Normal range of motion.  Neurological: He is alert and oriented to person, place, and time.  Skin: Skin is warm and dry. He is not diaphoretic.  Psychiatric: He has a normal mood and affect.  Nursing note and vitals reviewed.   ED Course  Procedures (including critical care time) Labs Review Labs Reviewed  URINALYSIS, ROUTINE W REFLEX MICROSCOPIC (NOT AT [facility]) - Abnormal; Notable for the following:    Specific Gravity, Urine 1.035 (*)    Glucose, UA >1000 (*)    Ketones, ur 15 (*)    All other components within normal limits  CBG MONITORING, ED - Abnormal; Notable for the following:    Glucose-Capillary 199 (*)    All other components within normal limits  URINE MICROSCOPIC-ADD ON  RPR  HIV ANTIBODY (ROUTINE TESTING)  GC/CHLAMYDIA PROBE AMP ([place]) NOT AT [facility]    Imaging Review No results found.   EKG Interpretation None      MDM   Final diagnoses:  Candidal balanitis    Patient is 24 year old insulin-dependent diabetic with signs and symptoms consistent with a balanitis. Etiology most likely candidal secondary to glucosurea greater than 1000 and poor compliance with insulin regimen. Based on patient's history of sexual partners, treated with azithromycin 2 g since patient is penicillin allergic. HIV and RPR sent. Patient is afebrile, hemodynamically stable and in no acute distress. Abdominal exam benign. No testicular pain or tenderness. No phimosis or paraphimosis. Patient discharged with miconazole prescription to apply topically and to follow-up with urology for further evaluation. Discussed the importance of proper insulin use, strongly recommended follow up with PCP. Return precautions  discussed, patient verbalizes understanding and agreement of this plan.  BP 102/51 mmHg  Pulse 80  Temp(Src) 97.9 F (36.6 C) (Oral)  Resp 16  Ht 5\' 10"  (1.778 m)  Wt 127 lb (57.607 kg)  BMI 18.22 kg/m2  SpO2 100%  Signed,  Ladona Mow, PA-C 12:27 PM  Patient discussed with Dr. Nelva Nay, MD    Ladona Mow, PA-C 10/05/14 1227  Nelva Nay, MD 10/06/14 [phone]

## 2014-10-05 NOTE — Discharge Instructions (Signed)
Balanitis Balanitis is inflammation of the head of the penis (glans).  CAUSES  Balanitis has multiple causes, both infectious and noninfectious. Often balanitis is the result of poor personal hygiene, especially in uncircumcised males. Without adequate washing, viruses, bacteria, and yeast collect between the foreskin and the glans. This can cause an infection. Lack of air and irritation from a normal secretion called smegma contribute to the cause in uncircumcised males. Other causes include:  Chemical irritation from the use of certain soaps and shower gels (especially soaps with perfumes), condoms, personal lubricants, petroleum jelly, spermicides, and fabric conditioners.  Skin conditions, such as eczema, dermatitis, and psoriasis.  Allergies to drugs, such as tetracycline and sulfa.  Certain medical conditions, including liver cirrhosis, congestive heart failure, and kidney disease.  Morbid obesity. RISK FACTORS  Diabetes mellitus.  A tight foreskin that is difficult to pull back past the glans (phimosis).  Sex without the use of a condom. SIGNS AND SYMPTOMS  Symptoms may include:  Discharge coming from under the foreskin.  Tenderness.  Itching and inability to get an erection (because of the pain).  Redness and a rash.  Sores on the glans and on the foreskin. DIAGNOSIS Diagnosis of balanitis is confirmed through a physical exam. TREATMENT The treatment is based on the cause of the balanitis. Treatment may include:  Frequent cleansing.  Keeping the glans and foreskin dry.  Use of medicines such as creams, pain medicines, antibiotics, or medicines to treat fungal infections.  Sitz baths. If the irritation has caused a scar on the foreskin that prevents easy retraction, a circumcision may be recommended.  HOME CARE INSTRUCTIONS  Sex should be avoided until the condition has cleared. MAKE SURE YOU:  Understand these instructions.  Will watch your  condition.  Will get help right away if you are not doing well or get worse. Document Released: 07/12/2008 Document Revised: 02/28/2013 Document Reviewed: 08/15/2012 [facility] Patient Information 2015 [place], [place]. This information is not intended to replace advice given to you by your health care provider. Make sure you discuss any questions you have with your health care provider.   Candida Infection A Candida infection (also called yeast, fungus, and Monilia infection) is an overgrowth of yeast that can occur anywhere on the body. A yeast infection commonly occurs in warm, moist body areas. Usually, the infection remains localized but can spread to become a systemic infection. A yeast infection may be a sign of a more severe disease such as diabetes, leukemia, or AIDS. A yeast infection can occur in both men and women. In women, Candida vaginitis is a vaginal infection. It is one of the most common causes of vaginitis. Men usually do not have symptoms or know they have an infection until other problems develop. Men may find out they have a yeast infection because their sex partner has a yeast infection. Uncircumcised men are more likely to get a yeast infection than circumcised men. This is because the uncircumcised glans is not exposed to air and does not remain as dry as that of a circumcised glans. Older adults may develop yeast infections around dentures. CAUSES  Women  Antibiotics.  Steroid medication taken for a long time.  Being overweight (obese).  Diabetes.  Poor immune condition.  Certain serious medical conditions.  Immune suppressive medications for organ transplant patients.  Chemotherapy.  Pregnancy.  Menstruation.  Stress and fatigue.  Intravenous drug use.  Oral contraceptives.  Wearing tight-fitting clothes in the crotch area.  Catching it from a sex  partner who has a yeast infection.  Spermicide.  Intravenous, urinary, or other  catheters. Men  Catching it from a sex partner who has a yeast infection.  Having oral or anal sex with a person who has the infection.  Spermicide.  Diabetes.  Antibiotics.  Poor immune system.  Medications that suppress the immune system.  Intravenous drug use.  Intravenous, urinary, or other catheters. SYMPTOMS  Women  Thick, white vaginal discharge.  Vaginal itching.  Redness and swelling in and around the vagina.  Irritation of the lips of the vagina and perineum.  Blisters on the vaginal lips and perineum.  Painful sexual intercourse.  Low blood sugar (hypoglycemia).  Painful urination.  Bladder infections.  Intestinal problems such as constipation, indigestion, bad breath, bloating, increase in gas, diarrhea, or loose stools. Men  Men may develop intestinal problems such as constipation, indigestion, bad breath, bloating, increase in gas, diarrhea, or loose stools.  Dry, cracked skin on the penis with itching or discomfort.  Jock itch.  Dry, flaky skin.  Athlete's foot.  Hypoglycemia. DIAGNOSIS  Women  A history and an exam are performed.  The discharge may be examined under a microscope.  A culture may be taken of the discharge. Men  A history and an exam are performed.  Any discharge from the penis or areas of cracked skin will be looked at under the microscope and cultured.  Stool samples may be cultured. TREATMENT  Women  Vaginal antifungal suppositories and creams.  Medicated creams to decrease irritation and itching on the outside of the vagina.  Warm compresses to the perineal area to decrease swelling and discomfort.  Oral antifungal medications.  Medicated vaginal suppositories or cream for repeated or recurrent infections.  Wash and dry the irritation areas before applying the cream.  Eating yogurt with Lactobacillus may help with prevention and treatment.  Sometimes painting the vagina with gentian violet  solution may help if creams and suppositories do not work. Men  Antifungal creams and oral antifungal medications.  Sometimes treatment must continue for 30 days after the symptoms go away to prevent recurrence. HOME CARE INSTRUCTIONS  Women  Use cotton underwear and avoid tight-fitting clothing.  Avoid colored, scented toilet paper and deodorant tampons or pads.  Do not douche.  Keep your diabetes under control.  Finish all the prescribed medications.  Keep your skin clean and dry.  Consume milk or yogurt with Lactobacillus-active culture regularly. If you get frequent yeast infections and think that is what the infection is, there are over-the-counter medications that you can get. If the infection does not show healing in 3 days, talk to your caregiver.  Tell your sex partner you have a yeast infection. Your partner may need treatment also, especially if your infection does not clear up or recurs. Men  Keep your skin clean and dry.  Keep your diabetes under control.  Finish all prescribed medications.  Tell your sex partner that you have a yeast infection so he or she can be treated if necessary. SEEK MEDICAL CARE IF:   Your symptoms do not clear up or worsen in one week after treatment.  You have an oral temperature above 102 F (38.9 C).  You have trouble swallowing or eating for a prolonged time.  You develop blisters on and around your vagina.  You develop vaginal bleeding and it is not your menstrual period.  You develop abdominal pain.  You develop intestinal problems as mentioned above.  You get weak or light-headed.  You have painful or increased urination.  You have pain during sexual intercourse. MAKE SURE YOU:   Understand these instructions.  Will watch your condition.  Will get help right away if you are not doing well or get worse. Document Released: 04/02/2004 Document Revised: 07/10/2013 Document Reviewed: 07/15/2009 [facility] Patient  Information 2015 [place], [place]. This information is not intended to replace advice given to you by your health care provider. Make sure you discuss any questions you have with your health care provider.   Emergency Department Resource Guide 1) Find a Doctor and Pay Out of Pocket Although you won't have to find out who is covered by your insurance plan, it is a good idea to ask around and get recommendations. You will then need to call the office and see if the doctor you have chosen will accept you as a new patient and what types of options they offer for patients who are self-pay. Some doctors offer discounts or will set up payment plans for their patients who do not have insurance, but you will need to ask so you aren't surprised when you get to your appointment.  2) Contact Your Local Health Department Not all health departments have doctors that can see patients for sick visits, but many do, so it is worth a call to see if yours does. If you don't know where your local health department is, you can check in your phone book. The CDC also has a tool to help you locate your state's health department, and many state websites also have listings of all of their local health departments.  3) Find a Walk-in Clinic If your illness is not likely to be very severe or complicated, you may want to try a walk in clinic. These are popping up all over the country in pharmacies, drugstores, and shopping centers. They're usually staffed by nurse practitioners or physician assistants that have been trained to treat common illnesses and complaints. They're usually fairly quick and inexpensive. However, if you have serious medical issues or chronic medical problems, these are probably not your best option.  No Primary Care Doctor: - Call Health Connect at  [phone] - they can help you locate a primary care doctor that  accepts your insurance, provides certain services, etc. - Physician Referral Service-  [phone]  Chronic Pain Problems: Organization         Address  Phone   Notes  [facility]  [phone] Patients need to be referred by their primary care doctor.   Medication Assistance: Organization         Address  Phone   Notes  [facility] Medication [facility] [address] [place] [place], [place] [address] [phone] --Must be a resident of [facility] -- Must have NO insurance coverage whatsoever (no Medicaid/ Medicare, etc.) -- The pt. MUST have a primary care doctor that directs their care regularly and follows them in the community   [facility]  [phone]   [employer]  [phone]    Agencies that provide inexpensive medical care: Organization         Address  Phone   Notes  [facility]  [phone]   [facility]    [phone]   [facility] [address] [place], [place] [address] [phone]   [facility] [address] [place] [place] [phone]   Planned Parenthood    [phone]  [facility]    [phone]   Community Health and [facility]  [address], [place] Phone:  [phone], Fax:  [phone] Hours of Operation:  9 am - 6 pm, M-F.  Also accepts Medicaid/Medicare and self-pay.  [facility]  [address], [place] Phone: [phone]) [phone], Fax: [phone]. Hours of Operation:  8:30 am - 5:30 pm, M-F.  Also accepts Medicaid and self-pay.  [facility] [place] [address], [place] Phone: [phone]   [facility] [address], [place], [place] [phone], Ext. [phone] Mondays & Thursdays: 7-9 AM.  First 15 patients are seen on a first come, first serve basis.    Medicaid-accepting [facility] [place] [place] Providers:  Organization         Address  Phone   Notes  [facility] [address] [place] [address],  [place] [phone] Also accepts self-pay patients.  [facility] [address], [place]  [phone]   [facility] [address], [place] [phone]   [facility] [address] [place] [address], [place] [phone]) [phone]   Renaye RakersVeita Bland [address], [place]   [phone] Only accepts [place] Access [place] patients after they have their name applied to their card.   Self-Pay (no insurance) in [facility] [place]:  Organization         Address  Phone   Notes  Sickle Cell Patients, [facility] [address], [place] [phone]   [facility] [address], [place] [phone]) [phone]   [facility]  [address], [place] [phone]   [facility]/Dr. Osei-Bonsu  [address] [place] [address], [place] or [phone], [place] [phone] Phone number for both [place] and [place] locations is the same.  Urgent Medical and [facility] [address], [place] [phone]   [facility]  [address] [place] [address], [place] or [address] [phone]   [facility] [address], [place] [phone], phone; [phone], fax Sees patients 1st and 3rd Saturday of every month.  Must not qualify for public or private insurance (i.e. Medicaid, Medicare, [place] Health Choice, Veterans' Benefits)  Household income should be no more than 200% of the poverty level The clinic cannot treat you if you are pregnant or think you are pregnant  Sexually transmitted diseases are not treated at the clinic.    Dental Care: Organization         Address  Phone  Notes  [facility] of [facility] [address], [place] [phone] Accepts children up to age 23 who are enrolled in  [place] or [place] Health Choice; pregnant women with a Medicaid card; and children who have applied for Medicaid or [place] Health Choice, but were declined, whose parents can pay a reduced fee at time of service.  [facility] of [facility] [place]  [address], [place] [phone] Accepts children up to age 23 who are enrolled in [place] or [place] Health Choice; pregnant women with a Medicaid card; and children who have applied for Medicaid or [place] Health Choice, but were declined, whose parents can pay a reduced fee at time  of service.  [facility]  [address] [place], [place] [phone] Patients are seen by appointment only. Walk-ins are not accepted. [facility] will see patients 60 years of age and older. Monday - Tuesday (8am-5pm) Most Wednesdays (8:30-5pm) $30 per visit, cash only  [facility] Adult Dental Access PROGRAM  [address], [facility] [phone] Patients are seen by appointment only. Walk-ins are not accepted. [facility] will see patients 66 years of age and older. One Wednesday Evening (Monthly: Volunteer Based).  $30 per visit, cash only  [employer]  [phone] for adults; Children under age 44, call Graduate Pediatric Dentistry at [phone]. Children aged 74-14, please call [phone] to request a pediatric application.  Dental services are provided in all areas of dental care including fillings, crowns and bridges, complete and partial dentures, implants, gum treatment, root canals, and extractions. Preventive care is also provided. Treatment is provided to both adults and children. Patients are selected via a lottery and there is often a waiting list.   [facility] [address] [place]  [phone] [email or web address]   [facility] [address] [place], [place] [phone], Ext. [phone] Second and Fourth Thursday of each month, opens at 6:30  AM; Clinic ends at 9 AM.  Patients are seen on a first-come first-served basis, and a limited number are seen during each clinic.   [facility]  [address] Ether [address]ins [place], [place] [phone]   Eligibility Requirements You must have lived in [place], [place], or [place] counties for at least the last three months.   You cannot be eligible for state or federal sponsored [place], including [employer], [place], or [employer].   You generally cannot be eligible for healthcare insurance through your employer.    How to apply: Eligibility screenings are held every Tuesday and Wednesday afternoon from 1:00 pm until 4:00 pm. You do not need an appointment for the interview!  [facility] [address], [place], [place] [phone]   [facility] Health Department  [phone]   [facility] Health Department  [phone]   [facility] Health Department  [phone]    Behavioral Health Resources in the Community: Intensive Outpatient Programs Organization         Address  Phone  Notes  [facility] Services [address], [place], [place] [phone]   [facility] [address] [place], [place] [phone]   [facility] [address] [place], [place]  [phone]   [facility] Mental Health [address],  [place], [place] [phone] or [phone]   Substance Abuse Resources Organization         Address  Phone  Notes  [facility] Drug Services  [phone]   [facility]  [phone]   [facility] [place]  [phone]   Floydene Flock  [phone]   Residential & Outpatient Substance Abuse Program  [phone]   Psychological Services Organization         Address  Phone  Notes  [facility] Behavioral Health  [phone]   [facility]  [phone]   [facility] Mental Health [address] [place] [phone] or  [phone]    Mobile Crisis Teams Organization         Address  Phone  Notes  [facility], Mobile Crisis Care Unit  [phone]   Assertive Psychotherapeutic  Services  [address] [place], [place] [phone]   [facility] [address] [place] [phone]    Self-Help/Support Groups Organization         Address  Phone             Notes  [facility] of [place] - variety of support groups  [phone]- I7437963 Call for more information  Narcotics Anonymous (NA), [facility] [address], [employer] Bonnie  2 meetings at this location   Statistician         Address  Phone  Notes  [facility] [address],    [place]  [phone]   [facility]  [address], [place] [address], [place], [place] [phone]   [facility] [address] [place], [place] [phone] Admissions: 8am-3pm M-F  [facility] [address],    [place], [place] [phone]   [facility] [address] [place], [place], [place] [phone]   [facility] [address],  [place], [place] [phone]   [facility] - Intensive Outpatient [address], [place], [place] [phone]   [facility] (Addiction Recovery [facility] [address] [place]  [place], [place] [phone] or 8087812666   [facility] ([facility]) [address], [place], [place] [phone] Accepts Medicaid  [facility] [place] [address]  [place] [phone] Substance Abuse/Addiction Treatment   [facility] Organization         Address  Phone  Notes  [facility]  [phone]   Angie Fava, PhD [address] [place], [place]   [phone] or [phone]   [facility] Behavioral   [address] [place], [place] [phone]   [facility] [address]  [place], [place] [phone] Insurance/Medicaid/sponsorship through [facility] and Families [address]                                    [place], [place] [phone] Therapy/tele-psych/case  [facility] [address], [place] [phone]    Dr. Lolly Mustache  [phone]   [facility] [place]  [employer] [facility] Dept. 1) [address], [place] 2) [address] [place] 3)  [address], [place] [phone]  [phone]   [facility] Child Abuse Hotline 765-016-0193 or [phone] (After Hours)

## 2014-10-05 NOTE — ED Notes (Signed)
Patient states yellowish discharge from penis x 1 week.   No urinary symptoms.   Patient denies other problems.

## 2014-10-08 LAB — GC/CHLAMYDIA PROBE AMP (~~LOC~~) NOT AT ARMC
Chlamydia: NEGATIVE
NEISSERIA GONORRHEA: NEGATIVE

## 2014-12-03 ENCOUNTER — Emergency Department (HOSPITAL_COMMUNITY)
Admission: EM | Admit: 2014-12-03 | Discharge: 2014-12-04 | Disposition: A | Discharge status: Home / Self Care | Payer: Self-pay | Attending: Emergency Medicine | Admitting: Emergency Medicine

## 2014-12-03 ENCOUNTER — Encounter (HOSPITAL_COMMUNITY): Payer: Self-pay | Admitting: Emergency Medicine

## 2014-12-03 DIAGNOSIS — Z79899 Other long term (current) drug therapy: Secondary | ICD-10-CM | POA: Insufficient documentation

## 2014-12-03 DIAGNOSIS — Z202 Contact with and (suspected) exposure to infections with a predominantly sexual mode of transmission: Secondary | ICD-10-CM | POA: Insufficient documentation

## 2014-12-03 DIAGNOSIS — R739 Hyperglycemia, unspecified: Secondary | ICD-10-CM

## 2014-12-03 DIAGNOSIS — J45909 Unspecified asthma, uncomplicated: Secondary | ICD-10-CM | POA: Insufficient documentation

## 2014-12-03 DIAGNOSIS — E1165 Type 2 diabetes mellitus with hyperglycemia: Secondary | ICD-10-CM | POA: Insufficient documentation

## 2014-12-03 DIAGNOSIS — Z711 Person with feared health complaint in whom no diagnosis is made: Secondary | ICD-10-CM

## 2014-12-03 DIAGNOSIS — Z794 Long term (current) use of insulin: Secondary | ICD-10-CM | POA: Insufficient documentation

## 2014-12-03 DIAGNOSIS — Z87891 Personal history of nicotine dependence: Secondary | ICD-10-CM | POA: Insufficient documentation

## 2014-12-03 DIAGNOSIS — Z880 Allergy status to penicillin: Secondary | ICD-10-CM | POA: Insufficient documentation

## 2014-12-03 LAB — CBC
HCT: 45.1 % (ref 39.0–52.0)
Hemoglobin: 16.3 g/dL (ref 13.0–17.0)
MCH: 28.8 pg (ref 26.0–34.0)
MCHC: 36.1 g/dL — ABNORMAL HIGH (ref 30.0–36.0)
MCV: 79.7 fL (ref 78.0–100.0)
PLATELETS: 219 10*3/uL (ref 150–400)
RBC: 5.66 MIL/uL (ref 4.22–5.81)
RDW: 12.2 % (ref 11.5–15.5)
WBC: 7 10*3/uL (ref 4.0–10.5)

## 2014-12-03 LAB — BASIC METABOLIC PANEL
Anion gap: 8 (ref 5–15)
BUN: 15 mg/dL (ref 6–20)
CALCIUM: 8.6 mg/dL — AB (ref 8.9–10.3)
CO2: 27 mmol/L (ref 22–32)
CREATININE: 1.15 mg/dL (ref 0.61–1.24)
Chloride: 96 mmol/L — ABNORMAL LOW (ref 101–111)
GFR calc Af Amer: >60 mL/min (ref >60)
Glucose, Bld: 479 mg/dL — ABNORMAL HIGH (ref 65–99)
Potassium: 4.1 mmol/L (ref 3.5–5.1)
SODIUM: 131 mmol/L — AB (ref 135–145)

## 2014-12-03 LAB — CBG MONITORING, ED
Glucose-Capillary: 468 mg/dL — ABNORMAL HIGH (ref 65–99)
Glucose-Capillary: 346 mg/dL — ABNORMAL HIGH (ref 65–99)

## 2014-12-03 MED ORDER — INSULIN ASPART 100 UNIT/ML ~~LOC~~ SOLN: 10.0000 [IU] | Freq: Once | SUBCUTANEOUS | Status: DC

## 2014-12-03 MED ORDER — SODIUM CHLORIDE 0.9 % IV BOLUS (SEPSIS)
1000.0000 mL | Freq: Once | INTRAVENOUS | Status: AC
  Administered 2014-12-03: 1000 mL via INTRAVENOUS

## 2014-12-03 MED ORDER — INSULIN ASPART 100 UNIT/ML ~~LOC~~ SOLN
3.0000 [IU] | Freq: Once | SUBCUTANEOUS | Status: AC
  Administered 2014-12-04: 3 [IU] via INTRAVENOUS
  Filled 2014-12-03: qty 1

## 2014-12-03 NOTE — ED Provider Notes (Signed)
CSN: [id number]     Arrival date & time 12/03/14  1953 History   First MD Initiated Contact with Patient 12/03/14 2210     Chief Complaint  Patient presents with  . Hyperglycemia    The patient said he has been fatigued since yesterday afternoon.  He says he thinks he is dehydrated.  The patient said he also wants to be tested for an STD.  He said he was unprotected sex two weeks ago.   HPI  Angel Barrett is a 24 year old male presenting with hyperglycemia and concerns for STD exposure. Pt states that he was feeling fatigued and "sluggish" yesterday which typically happens when his blood sugar is high. He does not have a glucose meter at home so he is unsure what his levels have been over the past few days. He states that he used to use a sliding scale to determine his insulin dosing but now he "just guesses". He typically takes 10 units 3 times a day with meals and states that he increases his dose with big meals or "if I feel like my sugar is high". He states that he does not have a PCP and buys his insulin OTC without prescription. Endorses polydipsia and increased urinary frequency. Denies fevers, chills, headaches, lightheadedness, blurred vision, cough, chest pain, SOB, abdominal pain, nausea or vomiting. Pt also states that he had unprotected intercourse a few weeks ago and would like STD testing. Denies dysuria, penile discharge, rashes, or testicular pain.   Past Medical History  Diagnosis Date  . Asthma   . Diabetes mellitus    History reviewed. No pertinent past surgical history. Family History  Problem Relation Age of Onset  . Diabetes Other    Social History  Substance Use Topics  . Smoking status: Former Smoker    Quit date: 10/08/2010  . Smokeless tobacco: Never Used  . Alcohol Use: Yes     Comment: socially    Review of Systems  Constitutional: Positive for fatigue. Negative for fever, chills and diaphoresis.  Eyes: Negative for visual disturbance.  Respiratory: Negative  for cough and shortness of breath.   Cardiovascular: Negative for chest pain.  Gastrointestinal: Negative for nausea, vomiting, abdominal pain and diarrhea.  Endocrine: Positive for polydipsia and polyuria.  Genitourinary: Positive for frequency. Negative for dysuria, discharge, penile pain and testicular pain.  Musculoskeletal: Negative for myalgias and back pain.  Skin: Negative for rash.  Neurological: Negative for headaches.      Allergies  Other; Ibuprofen; and Penicillins  Home Medications   Prior to Admission medications   Medication Sig Start Date End Date Taking? Authorizing Provider  albuterol (PROVENTIL HFA;VENTOLIN HFA) 108 (90 BASE) MCG/ACT inhaler Inhale 2 puffs into the lungs every 6 (six) hours as needed. For shortness of breath.   Yes Historical Provider, MD  insulin glargine (LANTUS) 100 UNIT/ML injection Inject 20 Units into the skin at bedtime.    Yes Historical Provider, MD  insulin regular (NOVOLIN R,HUMULIN R) 100 units/mL injection Inject 0.05-0.1 mLs (5-10 Units total) into the skin 3 (three) times daily before meals. Sliding scale Patient taking differently: Inject 5-20 Units into the skin 3 (three) times daily before meals. Sliding scale 06/19/14  Yes Gerhard Munch, MD  HYDROcodone-acetaminophen (HYCET) 7.5-325 mg/15 ml solution Take 10 mLs by mouth 4 (four) times daily as needed for moderate pain. Patient not taking: Reported on 10/05/2014 08/22/14   Marlon Pel, PA-C  miconazole (MICOTIN) 2 % cream Apply 1 application topically 2 (two)  times daily. 10/05/14   Ladona Mow, PA-C   BP 114/63 mmHg  Pulse 63  Temp(Src) 98.3 F (36.8 C) (Oral)  Resp 20  Ht  (1.778 m)  Wt 127 lb (57.607 kg)  BMI 18.22 kg/m2  SpO2 100% Physical Exam  Constitutional: He appears well-developed and well-nourished. No distress.  HENT:  Head: Normocephalic and atraumatic.  Mouth/Throat: Oropharynx is clear and moist. No oropharyngeal exudate.  Eyes: Conjunctivae and EOM  are normal. Pupils are equal, round, and reactive to light. Right eye exhibits no discharge. Left eye exhibits no discharge. No scleral icterus.  Neck: Normal range of motion. Neck supple.  Cardiovascular: Normal rate, regular rhythm and normal heart sounds.   Pulmonary/Chest: Effort normal and breath sounds normal. No respiratory distress. He has no wheezes. He has no rales.  Abdominal: Soft. Bowel sounds are normal. He exhibits no distension. There is no tenderness. There is no rebound and no guarding.  Musculoskeletal: Normal range of motion.  Pt moves all extremities spontaneously. Walks with a steady gait.  Neurological: He is alert. Coordination normal.  5/5 motor strength BUE and BLE. Sensation to light touch intact.   Skin: Skin is warm and dry. He is not diaphoretic.  Psychiatric: He has a normal mood and affect. His behavior is normal.  Nursing note and vitals reviewed.   ED Course  Procedures (including critical care time) Labs Review Labs Reviewed  BASIC METABOLIC PANEL - Abnormal; Notable for the following:    Sodium 131 (*)    Chloride 96 (*)    Glucose, Bld 479 (*)    Calcium 8.6 (*)    All other components within normal limits  CBC - Abnormal; Notable for the following:    MCHC 36.1 (*)    All other components within normal limits  URINALYSIS, ROUTINE W REFLEX MICROSCOPIC (NOT AT [facility]) - Abnormal; Notable for the following:    APPearance CLOUDY (*)    Specific Gravity, Urine 1.043 (*)    Glucose, UA >1000 (*)    All other components within normal limits  CBG MONITORING, ED - Abnormal; Notable for the following:    Glucose-Capillary 468 (*)    All other components within normal limits  CBG MONITORING, ED - Abnormal; Notable for the following:    Glucose-Capillary 346 (*)    All other components within normal limits  CBG MONITORING, ED - Abnormal; Notable for the following:    Glucose-Capillary 300 (*)    All other components within normal limits  CBG  MONITORING, ED - Abnormal; Notable for the following:    Glucose-Capillary 229 (*)    All other components within normal limits  URINE MICROSCOPIC-ADD ON  RPR  HIV ANTIBODY (ROUTINE TESTING)  GC/CHLAMYDIA PROBE AMP ([place]) NOT AT [facility]    Imaging Review No results found. I have personally reviewed and evaluated these images and lab results as part of my medical decision-making.   EKG Interpretation None      MDM   Final diagnoses:  Hyperglycemia  Concern about STD in male without diagnosis   Pt presenting with hyperglycemia and concerns for STD exposure as noted above. Pt asymptomatic in ED. Given 2 L of NS and 2 units of insulin in ED which brought his glucose from 486 to 229. Anion gap 8. Pt not in DKA. Pt does not have a PCP for diabetes control or glucose meter and buys his insulin OTC. Consult to case management who will contact patient to discuss help getting  a meter. Pt given referral to [facility] and strongly encouraged to establish primary care for diabetes control. Pt also requesting STD testing but declining swab. Will send GC urine. Offered pt prophylactic treatment of GC; pt declined. Return precautions given in discharge paperwork and discussed with pt at bedside. Pt stable for discharge   Alveta Heimlich, PA-C 12/04/14 1000  Donnetta Hutching, MD 12/04/14 [phone]

## 2014-12-03 NOTE — ED Notes (Signed)
The patient said he has been fatigued since yesterday afternoon.  He says he thinks he is dehydrated.  The patient said he also wants to be tested for an STD.  He said he was unprotected sex two weeks ago.

## 2014-12-04 LAB — URINALYSIS, ROUTINE W REFLEX MICROSCOPIC
Bilirubin Urine: NEGATIVE
Glucose, UA: >1000 mg/dL — AB
Hgb urine dipstick: NEGATIVE
KETONES UR: NEGATIVE mg/dL
LEUKOCYTES UA: NEGATIVE
Nitrite: NEGATIVE
PROTEIN: NEGATIVE mg/dL
Specific Gravity, Urine: 1.043 — ABNORMAL HIGH (ref 1.005–1.030)
UROBILINOGEN UA: 0.2 mg/dL (ref 0.0–1.0)
pH: 5.5 (ref 5.0–8.0)

## 2014-12-04 LAB — CBG MONITORING, ED
GLUCOSE-CAPILLARY: 300 mg/dL — AB (ref 65–99)
Glucose-Capillary: 229 mg/dL — ABNORMAL HIGH (ref 65–99)

## 2014-12-04 LAB — URINE MICROSCOPIC-ADD ON

## 2014-12-04 LAB — RPR: RPR Ser Ql: NONREACTIVE

## 2014-12-04 LAB — HIV ANTIBODY (ROUTINE TESTING W REFLEX): HIV Screen 4th Generation wRfx: NONREACTIVE

## 2014-12-04 NOTE — Discharge Instructions (Signed)
- Call [facility] to establish primary care  Hyperglycemia Hyperglycemia occurs when the glucose (sugar) in your blood is too high. Hyperglycemia can happen for many reasons, but it most often happens to people who do not know they have diabetes or are not managing their diabetes properly.  CAUSES  Whether you have diabetes or not, there are other causes of hyperglycemia. Hyperglycemia can occur when you have diabetes, but it can also occur in other situations that you might not be as aware of, such as: Diabetes  If you have diabetes and are having problems controlling your blood glucose, hyperglycemia could occur because of some of the following reasons:  Not following your meal plan.  Not taking your diabetes medications or not taking it properly.  Exercising less or doing less activity than you normally do.  Being sick. Pre-diabetes  This cannot be ignored. Before people develop Type 2 diabetes, they almost always have "pre-diabetes." This is when your blood glucose levels are higher than normal, but not yet high enough to be diagnosed as diabetes. Research has shown that some long-term damage to the body, especially the heart and circulatory system, may already be occurring during pre-diabetes. If you take action to manage your blood glucose when you have pre-diabetes, you may delay or prevent Type 2 diabetes from developing. Stress  If you have diabetes, you may be "diet" controlled or on oral medications or insulin to control your diabetes. However, you may find that your blood glucose is higher than usual in the hospital whether you have diabetes or not. This is often referred to as "stress hyperglycemia." Stress can elevate your blood glucose. This happens because of hormones put out by the body during times of stress. If stress has been the cause of your high blood glucose, it can be followed regularly by your caregiver. That way he/she can make sure your  hyperglycemia does not continue to get worse or progress to diabetes. Steroids  Steroids are medications that act on the infection fighting system (immune system) to block inflammation or infection. One side effect can be a rise in blood glucose. Most people can produce enough extra insulin to allow for this rise, but for those who cannot, steroids make blood glucose levels go even higher. It is not unusual for steroid treatments to "uncover" diabetes that is developing. It is not always possible to determine if the hyperglycemia will go away after the steroids are stopped. A special blood test called an A1c is sometimes done to determine if your blood glucose was elevated before the steroids were started. SYMPTOMS  Thirsty.  Frequent urination.  Dry mouth.  Blurred vision.  Tired or fatigue.  Weakness.  Sleepy.  Tingling in feet or leg. DIAGNOSIS  Diagnosis is made by monitoring blood glucose in one or all of the following ways:  A1c test. This is a chemical found in your blood.  Fingerstick blood glucose monitoring.  Laboratory results. TREATMENT  First, knowing the cause of the hyperglycemia is important before the hyperglycemia can be treated. Treatment may include, but is not be limited to:  Education.  Change or adjustment in medications.  Change or adjustment in meal plan.  Treatment for an illness, infection, etc.  More frequent blood glucose monitoring.  Change in exercise plan.  Decreasing or stopping steroids.  Lifestyle changes. HOME CARE INSTRUCTIONS   Test your blood glucose as directed.  Exercise regularly. Your caregiver will give you instructions about exercise. Pre-diabetes or diabetes which  comes on with stress is helped by exercising.  Eat wholesome, balanced meals. Eat often and at regular, fixed times. Your caregiver or nutritionist will give you a meal plan to guide your sugar intake.  Being at an ideal weight is important. If needed,  losing as little as 10 to 15 pounds may help improve blood glucose levels. SEEK MEDICAL CARE IF:   You have questions about medicine, activity, or diet.  You continue to have symptoms (problems such as increased thirst, urination, or weight gain). SEEK IMMEDIATE MEDICAL CARE IF:   You are vomiting or have diarrhea.  Your breath smells fruity.  You are breathing faster or slower.  You are very sleepy or incoherent.  You have numbness, tingling, or pain in your feet or hands.  You have chest pain.  Your symptoms get worse even though you have been following your caregiver's orders.  If you have any other questions or concerns. Document Released: 08/19/2000 Document Revised: 05/18/2011 Document Reviewed: 06/22/2011 [facility] Patient Information 2015 [place], [place]. This information is not intended to replace advice given to you by your health care provider. Make sure you discuss any questions you have with your health care provider.

## 2014-12-04 NOTE — ED Notes (Signed)
CBG 300 before the 3 units of insulin given one bag of fluids completed. 2nd bag of fluids started.
